# Patient Record
Sex: Female | Born: 1975
Health system: Southern US, Community
[De-identification: ages and names within clinical notes are randomized; demographics above are authoritative.]

## PROBLEM LIST (undated history)

## (undated) DIAGNOSIS — E221 Hyperprolactinemia: Secondary | ICD-10-CM

## (undated) DIAGNOSIS — N3010 Interstitial cystitis (chronic) without hematuria: Secondary | ICD-10-CM

## (undated) DIAGNOSIS — E282 Polycystic ovarian syndrome: Secondary | ICD-10-CM

## (undated) DIAGNOSIS — K5190 Ulcerative colitis, unspecified, without complications: Secondary | ICD-10-CM

## (undated) DIAGNOSIS — G43109 Migraine with aura, not intractable, without status migrainosus: Secondary | ICD-10-CM

## (undated) DIAGNOSIS — N809 Endometriosis, unspecified: Secondary | ICD-10-CM

## (undated) DIAGNOSIS — E049 Nontoxic goiter, unspecified: Secondary | ICD-10-CM

## (undated) HISTORY — PX: DIAGNOSTIC LAPAROSCOPY: SUR761

## (undated) HISTORY — DX: Ulcerative colitis, unspecified, without complications: K51.90

## (undated) HISTORY — DX: Polycystic ovarian syndrome: E28.2

## (undated) HISTORY — PX: APPENDECTOMY: SHX54

## (undated) HISTORY — DX: Nontoxic goiter, unspecified: E04.9

## (undated) HISTORY — DX: Interstitial cystitis (chronic) without hematuria: N30.10

## (undated) HISTORY — DX: Migraine with aura, not intractable, without status migrainosus: G43.109

## (undated) HISTORY — DX: Endometriosis, unspecified: N80.9

## (undated) HISTORY — DX: Hyperprolactinemia: E22.1

---

## 2006-11-28 ENCOUNTER — Ambulatory Visit: Payer: Self-pay | Admitting: Internal Medicine

## 2006-11-28 LAB — CONVERTED CEMR LAB
Basophils Relative: 0.3 % (ref 0.0–1.0)
Calcium: 8.8 mg/dL (ref 8.4–10.5)
Chloride: 105 meq/L (ref 96–112)
Creatinine, Ser: 0.7 mg/dL (ref 0.4–1.2)
Eosinophils Absolute: 0 10*3/uL (ref 0.0–0.6)
Eosinophils Relative: 1.2 % (ref 0.0–5.0)
GFR calc non Af Amer: 104 mL/min
Glucose, Bld: 81 mg/dL (ref 70–99)
HCT: 40 % (ref 36.0–46.0)
LDL Cholesterol: 127 mg/dL — ABNORMAL HIGH (ref 0–99)
Neutrophils Relative %: 41.3 % — ABNORMAL LOW (ref 43.0–77.0)
Platelets: 255 10*3/uL (ref 150–400)
Prolactin: 24.9 ng/mL
RBC: 4.26 M/uL (ref 3.87–5.11)
RDW: 12.9 % (ref 11.5–14.6)
Sodium: 139 meq/L (ref 135–145)
TSH: 1.19 microintl units/mL (ref 0.35–5.50)
Total CHOL/HDL Ratio: 3.9
Triglycerides: 94 mg/dL (ref 0–149)
VLDL: 19 mg/dL (ref 0–40)
WBC: 3.2 10*3/uL — ABNORMAL LOW (ref 4.5–10.5)

## 2006-12-12 ENCOUNTER — Other Ambulatory Visit: Admission: RE | Admit: 2006-12-12 | Discharge: 2006-12-12 | Payer: Self-pay | Admitting: Gynecology

## 2007-02-09 ENCOUNTER — Ambulatory Visit: Payer: Self-pay | Admitting: Internal Medicine

## 2007-05-08 ENCOUNTER — Encounter: Payer: Self-pay | Admitting: Internal Medicine

## 2007-07-23 ENCOUNTER — Ambulatory Visit: Payer: Self-pay | Admitting: Internal Medicine

## 2007-07-23 DIAGNOSIS — M2550 Pain in unspecified joint: Secondary | ICD-10-CM | POA: Insufficient documentation

## 2007-07-23 DIAGNOSIS — IMO0001 Reserved for inherently not codable concepts without codable children: Secondary | ICD-10-CM | POA: Insufficient documentation

## 2007-07-23 DIAGNOSIS — K5190 Ulcerative colitis, unspecified, without complications: Secondary | ICD-10-CM | POA: Insufficient documentation

## 2007-07-23 DIAGNOSIS — N808 Other endometriosis: Secondary | ICD-10-CM

## 2007-07-23 DIAGNOSIS — R109 Unspecified abdominal pain: Secondary | ICD-10-CM | POA: Insufficient documentation

## 2007-07-30 ENCOUNTER — Encounter (INDEPENDENT_AMBULATORY_CARE_PROVIDER_SITE_OTHER): Payer: Self-pay | Admitting: *Deleted

## 2007-07-30 LAB — CONVERTED CEMR LAB
Rhuematoid fact SerPl-aCnc: <20 intl units/mL — ABNORMAL LOW (ref 0.0–20.0)
Uric Acid, Serum: 3.4 mg/dL (ref 2.4–7.0)

## 2007-08-16 ENCOUNTER — Ambulatory Visit: Payer: Self-pay | Admitting: Gastroenterology

## 2007-08-16 LAB — CONVERTED CEMR LAB
Albumin: 3.9 g/dL (ref 3.5–5.2)
Alkaline Phosphatase: 67 units/L (ref 39–117)
BUN: 10 mg/dL (ref 6–23)
Eosinophils Absolute: 0.2 10*3/uL (ref 0.0–0.6)
Eosinophils Relative: 3.3 % (ref 0.0–5.0)
Ferritin: 100.9 ng/mL (ref 10.0–291.0)
Folate: 11.8 ng/mL
GFR calc Af Amer: 126 mL/min
GFR calc non Af Amer: 104 mL/min
Iron: 83 ug/dL (ref 42–145)
Lymphocytes Relative: 23.7 % (ref 12.0–46.0)
MCV: 96.4 fL (ref 78.0–100.0)
Monocytes Relative: 7.6 % (ref 3.0–11.0)
Neutro Abs: 3.8 10*3/uL (ref 1.4–7.7)
Platelets: 285 10*3/uL (ref 150–400)
Potassium: 3.8 meq/L (ref 3.5–5.1)
RBC: 3.91 M/uL (ref 3.87–5.11)
Saturation Ratios: 25.3 % (ref 20.0–50.0)
Sodium: 139 meq/L (ref 135–145)
TSH: 1.32 microintl units/mL (ref 0.35–5.50)
Tissue Transglutaminase Ab, IgA: 0.3 units (ref <7)
Transferrin: 234.6 mg/dL (ref >=212.0)
Vitamin B-12: 712 pg/mL (ref 211–911)
WBC: 5.8 10*3/uL (ref 4.5–10.5)

## 2007-09-11 ENCOUNTER — Ambulatory Visit: Payer: Self-pay | Admitting: Internal Medicine

## 2007-09-20 ENCOUNTER — Telehealth (INDEPENDENT_AMBULATORY_CARE_PROVIDER_SITE_OTHER): Payer: Self-pay | Admitting: *Deleted

## 2007-09-24 ENCOUNTER — Telehealth (INDEPENDENT_AMBULATORY_CARE_PROVIDER_SITE_OTHER): Payer: Self-pay | Admitting: *Deleted

## 2007-09-25 ENCOUNTER — Ambulatory Visit: Payer: Self-pay | Admitting: Internal Medicine

## 2007-10-17 ENCOUNTER — Encounter: Payer: Self-pay | Admitting: Internal Medicine

## 2007-11-01 ENCOUNTER — Ambulatory Visit: Payer: Self-pay | Admitting: Internal Medicine

## 2007-11-01 LAB — CONVERTED CEMR LAB
Ketones, urine, test strip: NEGATIVE
Nitrite: NEGATIVE
RBC / HPF: NONE SEEN (ref <3)
Specific Gravity, Urine: 1.02
Urobilinogen, UA: NEGATIVE
pH: 6

## 2007-11-02 ENCOUNTER — Encounter: Admission: RE | Admit: 2007-11-02 | Discharge: 2007-11-02 | Payer: Self-pay | Admitting: Internal Medicine

## 2007-11-02 ENCOUNTER — Encounter: Payer: Self-pay | Admitting: Internal Medicine

## 2007-11-05 ENCOUNTER — Telehealth (INDEPENDENT_AMBULATORY_CARE_PROVIDER_SITE_OTHER): Payer: Self-pay | Admitting: *Deleted

## 2007-11-20 DIAGNOSIS — D509 Iron deficiency anemia, unspecified: Secondary | ICD-10-CM

## 2007-11-20 DIAGNOSIS — E785 Hyperlipidemia, unspecified: Secondary | ICD-10-CM | POA: Insufficient documentation

## 2007-11-23 ENCOUNTER — Other Ambulatory Visit: Admission: RE | Admit: 2007-11-23 | Discharge: 2007-11-23 | Payer: Self-pay | Admitting: Obstetrics & Gynecology

## 2007-11-23 ENCOUNTER — Encounter: Payer: Self-pay | Admitting: Internal Medicine

## 2007-11-24 ENCOUNTER — Encounter: Admission: RE | Admit: 2007-11-24 | Discharge: 2007-11-24 | Payer: Self-pay | Admitting: Obstetrics & Gynecology

## 2007-11-29 ENCOUNTER — Encounter (INDEPENDENT_AMBULATORY_CARE_PROVIDER_SITE_OTHER): Payer: Self-pay | Admitting: *Deleted

## 2008-02-05 ENCOUNTER — Telehealth: Payer: Self-pay | Admitting: Internal Medicine

## 2008-02-08 ENCOUNTER — Encounter (INDEPENDENT_AMBULATORY_CARE_PROVIDER_SITE_OTHER): Payer: Self-pay | Admitting: *Deleted

## 2008-03-04 ENCOUNTER — Encounter: Payer: Self-pay | Admitting: Internal Medicine

## 2008-03-04 ENCOUNTER — Encounter: Payer: Self-pay | Admitting: Family Medicine

## 2008-03-11 ENCOUNTER — Encounter: Payer: Self-pay | Admitting: Family Medicine

## 2008-03-18 ENCOUNTER — Other Ambulatory Visit: Admission: RE | Admit: 2008-03-18 | Discharge: 2008-03-18 | Payer: Self-pay | Admitting: Obstetrics and Gynecology

## 2008-05-09 ENCOUNTER — Ambulatory Visit (HOSPITAL_BASED_OUTPATIENT_CLINIC_OR_DEPARTMENT_OTHER): Admission: RE | Admit: 2008-05-09 | Discharge: 2008-05-09 | Payer: Self-pay | Admitting: Urology

## 2008-06-30 ENCOUNTER — Encounter: Payer: Self-pay | Admitting: Internal Medicine

## 2008-08-21 ENCOUNTER — Ambulatory Visit: Payer: Self-pay | Admitting: Family Medicine

## 2008-08-21 DIAGNOSIS — J019 Acute sinusitis, unspecified: Secondary | ICD-10-CM | POA: Insufficient documentation

## 2008-10-18 ENCOUNTER — Emergency Department (HOSPITAL_BASED_OUTPATIENT_CLINIC_OR_DEPARTMENT_OTHER): Admission: EM | Admit: 2008-10-18 | Discharge: 2008-10-18 | Payer: Self-pay | Admitting: Emergency Medicine

## 2008-10-20 ENCOUNTER — Emergency Department (HOSPITAL_BASED_OUTPATIENT_CLINIC_OR_DEPARTMENT_OTHER): Admission: EM | Admit: 2008-10-20 | Discharge: 2008-10-20 | Payer: Self-pay | Admitting: Emergency Medicine

## 2008-11-04 ENCOUNTER — Ambulatory Visit: Payer: Self-pay | Admitting: Family Medicine

## 2008-11-04 DIAGNOSIS — N3000 Acute cystitis without hematuria: Secondary | ICD-10-CM | POA: Insufficient documentation

## 2008-11-11 ENCOUNTER — Ambulatory Visit: Payer: Self-pay | Admitting: Family Medicine

## 2008-11-11 LAB — CONVERTED CEMR LAB
Bilirubin Urine: NEGATIVE
Ketones, urine, test strip: NEGATIVE
Specific Gravity, Urine: 1.015
Urobilinogen, UA: 0.2

## 2008-11-23 LAB — CONVERTED CEMR LAB
AST: 27 units/L (ref 0–37)
Alkaline Phosphatase: 53 units/L (ref 39–117)
Basophils Absolute: 0.1 10*3/uL (ref 0.0–0.1)
Bilirubin, Direct: 0.1 mg/dL (ref 0.0–0.3)
Chloride: 99 meq/L (ref 96–112)
Eosinophils Absolute: 0.1 10*3/uL (ref 0.0–0.7)
GFR calc non Af Amer: 102 mL/min
HDL: 45.2 mg/dL (ref >39.0)
MCHC: 33.5 g/dL (ref 30.0–36.0)
MCV: 97 fL (ref 78.0–100.0)
Neutrophils Relative %: 37.1 % — ABNORMAL LOW (ref 43.0–77.0)
Platelets: 246 10*3/uL (ref 150–400)
Potassium: 3.4 meq/L — ABNORMAL LOW (ref 3.5–5.1)
Total Bilirubin: 0.9 mg/dL (ref 0.3–1.2)
Triglycerides: 170 mg/dL — ABNORMAL HIGH (ref 0–149)
VLDL: 34 mg/dL (ref 0–40)
WBC: 3.9 10*3/uL — ABNORMAL LOW (ref 4.5–10.5)

## 2008-11-24 ENCOUNTER — Telehealth (INDEPENDENT_AMBULATORY_CARE_PROVIDER_SITE_OTHER): Payer: Self-pay | Admitting: *Deleted

## 2008-11-24 ENCOUNTER — Encounter (INDEPENDENT_AMBULATORY_CARE_PROVIDER_SITE_OTHER): Payer: Self-pay | Admitting: *Deleted

## 2009-02-17 ENCOUNTER — Ambulatory Visit: Payer: Self-pay | Admitting: Family Medicine

## 2009-02-24 ENCOUNTER — Encounter (INDEPENDENT_AMBULATORY_CARE_PROVIDER_SITE_OTHER): Payer: Self-pay | Admitting: *Deleted

## 2009-02-24 LAB — CONVERTED CEMR LAB
ALT: 26 units/L (ref 0–35)
Basophils Relative: 1.4 % (ref 0.0–3.0)
Eosinophils Relative: 6.2 % — ABNORMAL HIGH (ref 0.0–5.0)
HCT: 39.3 % (ref 36.0–46.0)
HDL: 42.9 mg/dL (ref >39.00)
MCV: 94.7 fL (ref 78.0–100.0)
Monocytes Absolute: 0.3 10*3/uL (ref 0.1–1.0)
Neutrophils Relative %: 32.3 % — ABNORMAL LOW (ref 43.0–77.0)
RBC: 4.15 M/uL (ref 3.87–5.11)
Total Bilirubin: 0.7 mg/dL (ref 0.3–1.2)
Total CHOL/HDL Ratio: 5
Triglycerides: 161 mg/dL — ABNORMAL HIGH (ref 0.0–149.0)
WBC: 3.7 10*3/uL — ABNORMAL LOW (ref 4.5–10.5)

## 2009-03-23 ENCOUNTER — Ambulatory Visit: Payer: Self-pay | Admitting: Family Medicine

## 2009-03-23 DIAGNOSIS — M79609 Pain in unspecified limb: Secondary | ICD-10-CM

## 2009-03-23 DIAGNOSIS — E049 Nontoxic goiter, unspecified: Secondary | ICD-10-CM | POA: Insufficient documentation

## 2009-03-25 ENCOUNTER — Ambulatory Visit: Payer: Self-pay

## 2009-03-25 ENCOUNTER — Encounter: Payer: Self-pay | Admitting: Family Medicine

## 2009-03-26 ENCOUNTER — Telehealth (INDEPENDENT_AMBULATORY_CARE_PROVIDER_SITE_OTHER): Payer: Self-pay | Admitting: *Deleted

## 2009-03-26 ENCOUNTER — Encounter: Admission: RE | Admit: 2009-03-26 | Discharge: 2009-03-26 | Payer: Self-pay | Admitting: Family Medicine

## 2009-04-07 ENCOUNTER — Encounter (INDEPENDENT_AMBULATORY_CARE_PROVIDER_SITE_OTHER): Payer: Self-pay | Admitting: *Deleted

## 2009-04-20 ENCOUNTER — Ambulatory Visit: Payer: Self-pay | Admitting: Family Medicine

## 2009-04-20 LAB — CONVERTED CEMR LAB
Free T4: 0.8 ng/dL (ref 0.6–1.6)
TSH: 2.33 microintl units/mL (ref 0.35–5.50)

## 2009-04-21 ENCOUNTER — Encounter (INDEPENDENT_AMBULATORY_CARE_PROVIDER_SITE_OTHER): Payer: Self-pay | Admitting: *Deleted

## 2009-05-18 ENCOUNTER — Telehealth (INDEPENDENT_AMBULATORY_CARE_PROVIDER_SITE_OTHER): Payer: Self-pay | Admitting: *Deleted

## 2011-02-15 NOTE — Assessment & Plan Note (Signed)
[facility]                         GASTROENTEROLOGY OFFICE NOTE   Mindy Lowe, Mindy Lowe                    MRN:          [id number]  DATE:08/16/2007                            DOB:          January 26, 1976    Mindy Lowe is a 35 year old Hispanic female referred through the  courtesy of Dr. Drue Novel for evaluation of possible ulcerative colitis.   HISTORY OF PRESENT ILLNESS:  Some 10 years ago, the patient developed a  bloody diarrhea while living in [place], [place], and was diagnosed by a  gastroenterologist as having ulcerative colitis.  She was apparently  treated with oral zolpidem, but developed nausea and had to discontinue  this medication, but had a good response and has had no relapse in the  last 10 years.  In fact, she currently is constipated since being  started on iron therapy, because of some iron deficiency anemia,  associated with heavy menstrual blood loss.  She was on birth control  pills for 10 years, but stopped these 4 years ago.  She had been  diagnosed in the past as having endometriosis, and has had several  laparoscopies.  She also had laparoscopic appendectomy in 2003 for acute  appendicitis.   She currently has regular bowel movements and has had no rectal  bleeding.  She does have periodic crampy lower abdominal pain, but this  seems to be related to her menstrual cycle.  She has some gas and  bloating, but denies upper GI or hepatobiliary complaints.  She has  recently had some intermittent right CVA tenderness and apparently had a  negative urinalysis.  She gives no history of nephrolithiasis or other  genitourinary complaints.  Her appetite is good and she has, in fact,  gained 20 pounds over the last year.  She denies any specific food  intolerances.  She has no associated skin rashes, joint pains, or oral  stomatitis.  Recent work by Dr. Alwyn Ren showed a normal serum uric acid,  sedimentation rate of 18, and a negative  rheumatoid factor.   PAST MEDICAL HISTORY:  Otherwise, noncontributory, except for a history  of hyperlipidemia.   MEDICATIONS:  1. Flutamide 125 mg 2 twice a day for hair loss per her dermatologist.  2. Ferrex 150 mg a day.  3. Various vitamins and mineral supplements.  She denies drug      allergies.   FAMILY HISTORY:  Remarkable for ulcerative colitis in her father and 2  second cousins.  Her grandfather and father both have diabetes.  Her  grandmother had ovarian cancer.   SOCIAL HISTORY:  She is single and lives with her mother.  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  She does not smoke or  use ethanol.   REVIEW OF SYSTEMS:  Remarkable for some nonspecific night sweats,  excessive thirst, menometrorrhagia, chronic fatigue.  Her last menstrual  period was August 11, 2007.  Review of systems otherwise  noncontributory without any current cardiovascular, pulmonary,  genitourinary, neurological, orthopedic, or psychiatric difficulties.   EXAMINATION:  Shows her to be an attractive, healthy-appearing female in  no acute distress.  She is 5 feet 5 inches tall and weighs 159 pounds.  Blood pressure is  100/70 and pulse was 80 and regular.  I could not appreciate thyromegaly or lymphadenopathy.  Her chest was clear and she appeared to be in a regular rhythm without  murmurs, gallops, or rubs.  There is no hepatosplenomegaly, abdominal mass, but there was slight  tenderness over a mobile sigmoid colon in the left lower quadrant area.  Bowel sounds were normal.  Peripheral extremities were unremarkable.  Mental status was clear.  Inspection of the rectum was unremarkable, as was rectal exam.  There  was soft stool present that was guaiac negative.   ASSESSMENT:  1. Left lower quadrant pain, which seems to be menstrual related and I      suspect she has endometrial involvement of her rectosigmoid area.  2. Vague previous history of ulcerative colitis.  She  certainly has no      symptomatology now that suggests active inflammatory bowel disease.  3. Status post appendectomy and several laparoscopic exams for      endometrial ablation.  4. Alopecia of unexplained etiology, being followed by a dermatologist      in [facility].  5. Iron deficiency anemia related to menometrorrhagia.  6. Family history of inflammatory bowel disease, ovarian cancer, and      diabetes.  7. Nonspecific right costovertebral angle tenderness without any known      history of hepatobiliary problems.   RECOMMENDATIONS:  1. Screening laboratory parameters including, at her request, sprue      antibodies and IBD serology.  2. Anemia profile.  Check iron levels.  3. Baseline colonoscopy exam.  4. Consider referral back to gynecology for possible repeat      laparoscopic evaluation to continue on her workup and colonoscopy      results.     Mindy Lowe. Jarold Motto, MD, Caleen Essex, FAGA  Electronically Signed    DRP/MedQ  DD: 08/16/2007  DT: 08/16/2007  Job #: [id number]   cc:   Willow Ora, MD  Dr. Sharol Harness, M.D.

## 2011-02-15 NOTE — Op Note (Signed)
[name], [name]           ACCOUNT NO.:  [id number]   MEDICAL RECORD NO.:  [id number]          PATIENT TYPE:  AMB   LOCATION:  [facility]                         FACILITY:  [facility]   PHYSICIAN:  Martina Sinner, MD DATE OF BIRTH:  1976-05-11   DATE OF PROCEDURE:  05/09/2008  DATE OF DISCHARGE:                               OPERATIVE REPORT   PREOPERATIVE DIAGNOSES:  1. Pelvic pain  2. Frequency.   POSTOPERATIVE DIAGNOSES:  1. Pelvic pain  2. Frequency.   PROCEDURES:  Cystoscopy, hydrodistention, and bladder instillation  therapy.   [name], based upon her history of and urodynamics, I felt she  likely had interstitial cystitis.   The patient was prepped and draped in the usual fashion.  She was given  preoperative antibiotics.  She was cystoscope with a 17-French scope.  Bladder mucosa and trigone were normal.  There was no stitch, foreign  body, or carcinoma.  She was dilated to 700 mL twice.  She had a few  petechiae at 5 and 7 o'clock after I emptied the bladder and relooked.  The petechiae were just lateral to the ureteral orifices bilaterally.  The findings were not that impressive.   I instilled 15 cc of .5% marcaine and 400 mg of pyridium with a small  red rubber catheter.   The cystoscopic findings for Ms. Mindy Lowe were not that impressive for  interstitial cystitis, but when I look at the entire clinical situation,  I think she does have interstitial cystitis.  If she does go on to have  bladder instillation rescue treatments and responds favorably, this  would also support the diagnosis.   The patient will return in a week.           ______________________________  Martina Sinner, MD  Electronically Signed     SAM/MEDQ  D:  05/09/2008  T:  05/09/2008  Job:  [id number]

## 2011-07-01 LAB — POCT PREGNANCY, URINE: Preg Test, Ur: NEGATIVE

## 2011-07-01 LAB — POCT HEMOGLOBIN-HEMACUE: Hemoglobin: 15

## 2012-07-10 ENCOUNTER — Other Ambulatory Visit: Payer: Self-pay | Admitting: *Deleted

## 2012-07-10 DIAGNOSIS — E049 Nontoxic goiter, unspecified: Secondary | ICD-10-CM

## 2012-07-10 DIAGNOSIS — E041 Nontoxic single thyroid nodule: Secondary | ICD-10-CM

## 2012-07-13 ENCOUNTER — Ambulatory Visit
Admission: RE | Admit: 2012-07-13 | Discharge: 2012-07-13 | Disposition: A | Discharge status: Home / Self Care | Payer: 59 | Source: Ambulatory Visit | Attending: *Deleted | Admitting: *Deleted

## 2012-07-13 DIAGNOSIS — E049 Nontoxic goiter, unspecified: Secondary | ICD-10-CM

## 2012-11-16 ENCOUNTER — Encounter: Payer: Self-pay | Admitting: Gynecology

## 2012-11-16 DIAGNOSIS — N809 Endometriosis, unspecified: Secondary | ICD-10-CM | POA: Insufficient documentation

## 2012-11-16 DIAGNOSIS — G43109 Migraine with aura, not intractable, without status migrainosus: Secondary | ICD-10-CM | POA: Insufficient documentation

## 2012-11-16 DIAGNOSIS — N3010 Interstitial cystitis (chronic) without hematuria: Secondary | ICD-10-CM | POA: Insufficient documentation

## 2012-11-16 DIAGNOSIS — E221 Hyperprolactinemia: Secondary | ICD-10-CM | POA: Insufficient documentation

## 2012-11-16 DIAGNOSIS — E282 Polycystic ovarian syndrome: Secondary | ICD-10-CM | POA: Insufficient documentation

## 2013-01-02 ENCOUNTER — Encounter: Payer: Self-pay | Admitting: Gynecology

## 2013-01-02 ENCOUNTER — Ambulatory Visit (INDEPENDENT_AMBULATORY_CARE_PROVIDER_SITE_OTHER): Payer: 59 | Admitting: Gynecology

## 2013-01-02 VITALS — BP 112/60 | Resp 12 | Wt 146.0 lb

## 2013-01-02 DIAGNOSIS — N803 Endometriosis of pelvic peritoneum: Secondary | ICD-10-CM

## 2013-01-02 DIAGNOSIS — N760 Acute vaginitis: Secondary | ICD-10-CM

## 2013-01-02 DIAGNOSIS — Z79899 Other long term (current) drug therapy: Secondary | ICD-10-CM

## 2013-01-02 MED ORDER — NORETHIN ACE-ETH ESTRAD-FE 1-20 MG-MCG(24) PO TABS: 1.0000 | ORAL_TABLET | Freq: Every day | ORAL | Status: DC

## 2013-01-02 MED ORDER — HYDROCORTISONE ACETATE 25 MG RE SUPP: 25.0000 mg | Freq: Every day | RECTAL | Status: DC

## 2013-01-02 MED ORDER — FLUCONAZOLE 150 MG PO TABS: 150.0000 mg | ORAL_TABLET | Freq: Once | ORAL | Status: DC

## 2013-01-02 NOTE — Progress Notes (Signed)
PT to discuss medication, on Dostinex for hyperprolactinemia overall feels great.  Stopped spironolactone.  Pt reports pain from endometriosis is reduced using occaisional aspirin menses better on LoEstrin.  Pt contemplating pregnancy has questions re Dostinex and endometriosis.  Also reports chronic vulvar itch  Review of Systems - Negative except  vulvar itching as above.  Pt denies headache, pelvic pain, dysmenorrhea.  Pt reports acne resolved and alopecia improved.  Physical Examination: General appearance - alert, well appearing, and in no distress Pelvic - normal external genitalia, vulva, cervix, uterus and adnexa, VAGINA: normal appearing vagina with normal color, thin white  discharge, no lesions, vaginal tenderness nonspecific,  vaginal discharge - white and milky, WET MOUNT done - results: negative for pathogens, normal epithelial cells, vaginal pH is 5, white blood cells-many, few yeast Neurological - alert, oriented, normal speech, no focal findings or movement disorder noted  Assessment: Hyperprolactinemia-doing well on Dostinex Chronic vulvitis  Plan: Con't dostinex-stressed importance for regulation of menses D/C consistent with DIV, will treat locally with vaginal steroids-will treat for yeast at onset and completion of RX F/u 10 to confirm clearance

## 2013-01-02 NOTE — Patient Instructions (Signed)
Candida Infection, Adult  A candida infection (also called yeast, fungus and Monilia infection) is an overgrowth of yeast that can occur anywhere on the body. A yeast infection commonly occurs in warm, moist body areas. Usually, the infection remains localized but can spread to become a systemic infection. A yeast infection may be a sign of a more severe disease such as diabetes, leukemia, or AIDS.  A yeast infection can occur in both men and women. In women, Candida vaginitis is a vaginal infection. It is one of the most common causes of vaginitis. Men usually do not have symptoms or know they have an infection until other problems develop. Men may find out they have a yeast infection because their sex partner has a yeast infection. Uncircumcised men are more likely to get a yeast infection than circumcised men. This is because the uncircumcised glans is not exposed to air and does not remain as dry as that of a circumcised glans. Older adults may develop yeast infections around dentures.  CAUSES   Women   Antibiotics.   Steroid medication taken for a long time.   Being overweight (obese).   Diabetes.   Poor immune condition.   Certain serious medical conditions.   Immune suppressive medications for organ transplant patients.   Chemotherapy.   Pregnancy.   Menstration.   Stress and fatigue.   Intravenous drug use.   Oral contraceptives.   Wearing tight-fitting clothes in the crotch area.   Catching it from a sex partner who has a yeast infection.   Spermicide.   Intravenous, urinary, or other catheters.  Men   Catching it from a sex partner who has a yeast infection.   Having oral or anal sex with a person who has the infection.   Spermicide.   Diabetes.   Antibiotics.   Poor immune system.   Medications that suppress the immune system.   Intravenous drug use.   Intravenous, urinary, or other catheters.  SYMPTOMS   Women   Thick, white vaginal discharge.   Vaginal itching.   Redness and  swelling in and around the vagina.   Irritation of the lips of the vagina and perineum.   Blisters on the vaginal lips and perineum.   Painful sexual intercourse.   Low blood sugar (hypoglycemia).   Painful urination.   Bladder infections.   Intestinal problems such as constipation, indigestion, bad breath, bloating, increase in gas, diarrhea, or loose stools.  Men   Men may develop intestinal problems such as constipation, indigestion, bad breath, bloating, increase in gas, diarrhea, or loose stools.   Dry, cracked skin on the penis with itching or discomfort.   Jock itch.   Dry, flaky skin.   Athlete's foot.   Hypoglycemia.  DIAGNOSIS   Women   A history and an exam are performed.   The discharge may be examined under a microscope.   A culture may be taken of the discharge.  Men   A history and an exam are performed.   Any discharge from the penis or areas of cracked skin will be looked at under the microscope and cultured.   Stool samples may be cultured.  TREATMENT   Women   Vaginal antifungal suppositories and creams.   Medicated creams to decrease irritation and itching on the outside of the vagina.   Warm compresses to the perineal area to decrease swelling and discomfort.   Oral antifungal medications.   Medicated vaginal suppositories or cream for repeated or recurrent infections.     Wash and dry the irritation areas before applying the cream.   Eating yogurt with lactobacillus may help with prevention and treatment.   Sometimes painting the vagina with gentian violet solution may help if creams and suppositories do not work.  Men   Antifungal creams and oral antifungal medications.   Sometimes treatment must continue for 30 days after the symptoms go away to prevent recurrence.  HOME CARE INSTRUCTIONS   Women   Use cotton underwear and avoid tight-fitting clothing.   Avoid colored, scented toilet paper and deodorant tampons or pads.   Do not douche.   Keep your diabetes  under control.   Finish all the prescribed medications.   Keep your skin clean and dry.   Consume milk or yogurt with lactobacillus active culture regularly. If you get frequent yeast infections and think that is what the infection is, there are over-the-counter medications that you can get. If the infection does not show healing in 3 days, talk to your caregiver.   Tell your sex partner you have a yeast infection. Your partner may need treatment also, especially if your infection does not clear up or recurs.  Men   Keep your skin clean and dry.   Keep your diabetes under control.   Finish all prescribed medications.   Tell your sex partner that you have a yeast infection so they can be treated if necessary.  SEEK MEDICAL CARE IF:    Your symptoms do not clear up or worsen in one week after treatment.   You have an oral temperature above 102 F (38.9 C).   You have trouble swallowing or eating for a prolonged time.   You develop blisters on and around your vagina.   You develop vaginal bleeding and it is not your menstrual period.   You develop abdominal pain.   You develop intestinal problems as mentioned above.   You get weak or lightheaded.   You have painful or increased urination.   You have pain during sexual intercourse.  MAKE SURE YOU:    Understand these instructions.   Will watch your condition.   Will get help right away if you are not doing well or get worse.  Document Released: 10/27/2004 Document Revised: 12/12/2011 Document Reviewed: 02/08/2010  Exit[employer] Patient Information 2013 [employer].

## 2013-01-07 ENCOUNTER — Telehealth: Payer: Self-pay | Admitting: Gynecology

## 2013-01-07 NOTE — Telephone Encounter (Signed)
She will use for 1w, vaginally she is to come in for test of cure, sometimes it requires retreatment, so don't discard the extras

## 2013-01-07 NOTE — Telephone Encounter (Signed)
Spoke with pt about med she is confused about. Says pharmacy gave her Anucort HC suppositories, which pt says, according to the box, are to be used rectally. Instructed pt that they are a hydrocortisone suppository, which is what Dr Farrel Gobble wanted her to use, and they are fine to use pv. Pt was given 24 suppositories and thought she was to use them only for 7 nights. Pt just wanted to make sure she should only use 7 nights.  aa

## 2013-01-07 NOTE — Telephone Encounter (Signed)
Amy have you talked to this patient regarding this?

## 2013-01-07 NOTE — Telephone Encounter (Signed)
pt thinks wrong rx may have been called into pharmacy at last visit/please advise/[place]

## 2013-01-08 NOTE — Telephone Encounter (Signed)
LM on pt's VM confirming name that suppositories are correct, and she should use them for 7 days. Instructed to not discard the extras. Instructed pt to call back for TOC appt.  aa

## 2013-01-17 ENCOUNTER — Ambulatory Visit: Payer: 59 | Admitting: Gynecology

## 2013-01-22 ENCOUNTER — Other Ambulatory Visit: Payer: Self-pay | Admitting: Gynecology

## 2013-01-22 ENCOUNTER — Ambulatory Visit (INDEPENDENT_AMBULATORY_CARE_PROVIDER_SITE_OTHER): Payer: 59 | Admitting: Gynecology

## 2013-01-22 VITALS — BP 94/58 | Temp 98.3°F | Resp 16

## 2013-01-22 DIAGNOSIS — N72 Inflammatory disease of cervix uteri: Secondary | ICD-10-CM

## 2013-01-22 DIAGNOSIS — R300 Dysuria: Secondary | ICD-10-CM

## 2013-01-22 LAB — POCT URINALYSIS DIPSTICK
Urobilinogen, UA: NEGATIVE
pH, UA: 5

## 2013-01-22 MED ORDER — DOXYCYCLINE HYCLATE 100 MG PO CAPS: 100.0000 mg | ORAL_CAPSULE | Freq: Two times a day (BID) | ORAL | Status: DC

## 2013-01-22 NOTE — Patient Instructions (Signed)
Start doxycycline for now will update treatment as results come in, pelvic rest, follow up based on findings

## 2013-01-22 NOTE — Progress Notes (Signed)
Subjective:     Patient ID: Mindy Lowe, female   DOB: [birth year], 37 y.o.   MRN: [id number]  HPI Comments: Pt completed vaginal steroids finished about 8d ago, treated for yeast as well, Pt reports no improvement at all, more irritation inside, reports discharge unchanged.  Reports sexually active after completing rx, painful, burning.  Pt reports back pain began 4d ago, took aspirin for 2d now resolved, pt reports urine cloudy  Back Pain     Review of Systems  Musculoskeletal: Positive for back pain.  All other systems reviewed and are negative.       Objective:   Physical Exam  Constitutional: She appears well-developed and well-nourished.  Genitourinary: There is no rash, tenderness or lesion on the right labia. There is no rash, tenderness or lesion on the left labia. Uterus is tender. Cervix exhibits motion tenderness and friability. There is tenderness around the vagina.  Grey, creamy vaginal discharge noted, nonspecific tenderness in vagina, cervix and uterus       Assessment:     Nonspecific vaginitis    Plan:     cerivcal swab done for GC/CTM, yeast-[id number]-trich, BV Empirical treatment with doxycycline Will plan follow up based on lab results, pelvic rest-pt agreeable

## 2013-01-24 ENCOUNTER — Ambulatory Visit: Payer: 59 | Admitting: Gynecology

## 2013-01-25 LAB — STD SCREENING PANEL/HIGH RISK
Chlamydia trachomatis, NAA: NOT DETECTED
Gardnerella vaginalis: NOT DETECTED

## 2013-02-26 ENCOUNTER — Telehealth: Payer: Self-pay | Admitting: *Deleted

## 2013-02-26 NOTE — Telephone Encounter (Signed)
Left Message To Call Back Re: (See Lab STD PANEL SCREENING )Per Dr. Farrel Gobble pt needs to come in re having pain in leg; Dr. Farrel Gobble wants to make sure it's not DVT, as far as pt. Being switched to loestrin, lomedia is loestrin, Dr. Farrel Gobble doesn't have a problem switching her but it won't be to loestrin since it's no longer on the market.

## 2013-02-26 NOTE — Telephone Encounter (Signed)
Notified pt. She is aware (see below) scheduled her to come in 03/04/13 @ 4:45

## 2013-03-04 ENCOUNTER — Ambulatory Visit (INDEPENDENT_AMBULATORY_CARE_PROVIDER_SITE_OTHER): Payer: 59 | Admitting: Gynecology

## 2013-03-04 VITALS — BP 112/60 | Resp 14 | Wt 144.0 lb

## 2013-03-04 DIAGNOSIS — M79661 Pain in right lower leg: Secondary | ICD-10-CM

## 2013-03-04 DIAGNOSIS — IMO0001 Reserved for inherently not codable concepts without codable children: Secondary | ICD-10-CM

## 2013-03-04 DIAGNOSIS — M79609 Pain in unspecified limb: Secondary | ICD-10-CM

## 2013-03-04 DIAGNOSIS — N644 Mastodynia: Secondary | ICD-10-CM

## 2013-03-04 DIAGNOSIS — Z309 Encounter for contraceptive management, unspecified: Secondary | ICD-10-CM

## 2013-03-04 MED ORDER — NORETHIN ACE-ETH ESTRAD-FE 1-20 MG-MCG PO TABS: 1.0000 | ORAL_TABLET | Freq: Every day | ORAL | Status: DC

## 2013-03-04 NOTE — Progress Notes (Signed)
Subjective:     Patient ID: Mindy Lowe, female   DOB: 17-Mar-1976, 37 y.o.   MRN: [id number]  HPI Comments: Pt asked to come in after complaining of right calf tenderness for 24m, pt denies swelling, change in color, ambulation or strength.  Pt has been doing spin classes 15m.  In addition pt reports hair loss since being on Lomedia as well as breast tenderness on the left.  She continues to take her dostinex and has no nipple discharge.  Pt would like to return to LoEstrin product    Review of Systems  All other systems reviewed and are negative.       Objective:   Physical Exam  Constitutional: She appears well-developed and well-nourished.  Pulmonary/Chest: Right breast exhibits no inverted nipple, no mass, no nipple discharge, no skin change and no tenderness. Left breast exhibits mass and tenderness. Left breast exhibits no inverted nipple, no nipple discharge and no skin change.    Genitourinary: Tenderness: lateral left breast.  Musculoskeletal: She exhibits no edema and no tenderness.       Right knee: Normal. She exhibits no swelling, no effusion, no ecchymosis, no deformity, no erythema and normal patellar mobility. No tenderness found.       Right lower leg: Normal. She exhibits no tenderness and no edema.       Assessment:     Calf pain Breast mass-cystic     Plan:     Pt assured pain is behind knee and negative Homen's sign, no progression in 64m, suspected related to new onset of spin class-assured Will change back to LoEstrin per request Refer to breast center for U/s and diagnostic mammo-suspect fibrocystic

## 2013-03-05 ENCOUNTER — Other Ambulatory Visit: Payer: Self-pay | Admitting: Gynecology

## 2013-03-05 ENCOUNTER — Telehealth: Payer: Self-pay | Admitting: *Deleted

## 2013-03-05 DIAGNOSIS — N644 Mastodynia: Secondary | ICD-10-CM

## 2013-03-05 NOTE — Telephone Encounter (Signed)
Patient notified of Diagnostic Digital Mammogram and ultrasound to be done at [facility] June 4th @ 3:00pm. Dr Farrel Gobble need to put in order for ultrasound [facility]. sue

## 2013-03-06 ENCOUNTER — Ambulatory Visit
Admission: RE | Admit: 2013-03-06 | Discharge: 2013-03-06 | Disposition: A | Discharge status: Home / Self Care | Payer: 59 | Source: Ambulatory Visit | Attending: Gynecology | Admitting: Gynecology

## 2013-03-06 DIAGNOSIS — N644 Mastodynia: Secondary | ICD-10-CM

## 2013-03-12 ENCOUNTER — Telehealth: Payer: Self-pay | Admitting: *Deleted

## 2013-03-12 NOTE — Telephone Encounter (Signed)
Left Message To Call Back  

## 2013-03-12 NOTE — Telephone Encounter (Signed)
Message copied by Lorraine Lax on Tue Mar 12, 2013  8:13 AM ------      Message from: Douglass Rivers      Created: Mon Mar 11, 2013  4:58 PM       Can pt rto to confirm mass/LN has resolved in 34m ------

## 2013-03-14 NOTE — Telephone Encounter (Signed)
Appt scheduled for 06/17/13 @ 4:45 (see mmg result message)

## 2013-04-27 ENCOUNTER — Other Ambulatory Visit: Payer: Self-pay | Admitting: Gynecology

## 2013-04-29 ENCOUNTER — Telehealth: Payer: Self-pay | Admitting: *Deleted

## 2013-04-29 MED ORDER — CABERGOLINE 0.5 MG PO TABS: 0.2500 mg | ORAL_TABLET | ORAL | Status: DC

## 2013-04-29 NOTE — Telephone Encounter (Signed)
Refill for Dostinex 0.5 mg  (2 tabs weekly) #8/0 refills sent to pt's pharmacy. Pt will call office to schedule aex appt. Pt aware of no more refills until aex.  

## 2013-04-29 NOTE — Telephone Encounter (Signed)
Ok to refill for 53m, please remind pt she is overdue for annual

## 2013-04-29 NOTE — Telephone Encounter (Signed)
Pt is requesting a refill on Dostinex. Last Rx for this medication was given to pt on 10/08/2012 - #8,  x6 refills. Please advise.

## 2013-04-29 NOTE — Telephone Encounter (Signed)
Refill for Dostinex 0.5 mg  (2 tabs weekly) #8/0 refills sent to pt's pharmacy. Pt will call office to schedule aex appt. Pt aware of no more refills until aex.

## 2013-05-13 ENCOUNTER — Telehealth: Payer: Self-pay | Admitting: *Deleted

## 2013-05-13 NOTE — Telephone Encounter (Signed)
Patient notified that being on a lower does can be normal not to have withdrawal bleeding, pt has been taking ocp as you have instructed her to she says she's sure she's not pregnant. Pt is agreeable and aware.

## 2013-05-13 NOTE — Telephone Encounter (Signed)
Patient calling to say she hasn't had a cycle in 2 months she is on loestrin 1/20 wants to know if this is normal please advise.

## 2013-05-13 NOTE — Telephone Encounter (Signed)
On some of the lower dose pills it can be normal to not have a withdraw bleed, as long as she is taking it correctly and has not added any other medications she should be fine, she can take a otc upt for assurance

## 2013-06-04 ENCOUNTER — Telehealth: Payer: Self-pay | Admitting: Gynecology

## 2013-06-04 NOTE — Telephone Encounter (Signed)
Microgestin refill needed please.  [phone] [facility]

## 2013-06-04 NOTE — Telephone Encounter (Signed)
Correction 3 packs with 3 rf's

## 2013-06-04 NOTE — Telephone Encounter (Signed)
03/04/13 Dr. Farrel Gobble sent through 3 packs/4 rf's to patient's pharmacy she is aware.

## 2013-06-17 ENCOUNTER — Ambulatory Visit: Payer: 59 | Admitting: Gynecology

## 2013-06-23 ENCOUNTER — Other Ambulatory Visit: Payer: Self-pay | Admitting: Gynecology

## 2013-06-24 NOTE — Telephone Encounter (Signed)
Please advise if should refill this. Patient has 3 month breast recheck scheduled for 07/19/13. Chart is on your desk.

## 2013-07-19 ENCOUNTER — Encounter: Payer: Self-pay | Admitting: Gynecology

## 2013-07-19 ENCOUNTER — Ambulatory Visit (INDEPENDENT_AMBULATORY_CARE_PROVIDER_SITE_OTHER): Payer: 59 | Admitting: Gynecology

## 2013-07-19 VITALS — BP 116/76 | HR 80 | Resp 18 | Ht 64.25 in | Wt 139.0 lb

## 2013-07-19 DIAGNOSIS — R5381 Other malaise: Secondary | ICD-10-CM

## 2013-07-19 DIAGNOSIS — N644 Mastodynia: Secondary | ICD-10-CM

## 2013-07-19 DIAGNOSIS — IMO0001 Reserved for inherently not codable concepts without codable children: Secondary | ICD-10-CM

## 2013-07-19 DIAGNOSIS — Z309 Encounter for contraceptive management, unspecified: Secondary | ICD-10-CM

## 2013-07-19 DIAGNOSIS — E229 Hyperfunction of pituitary gland, unspecified: Secondary | ICD-10-CM

## 2013-07-19 DIAGNOSIS — E221 Hyperprolactinemia: Secondary | ICD-10-CM

## 2013-07-19 LAB — CBC
HCT: 39.3 % (ref 36.0–46.0)
Hemoglobin: 13.4 g/dL (ref 12.0–15.0)
MCV: 97 fL (ref 78.0–100.0)
RDW: 13.8 % (ref 11.5–15.5)
WBC: 5.5 10*3/uL (ref 4.0–10.5)

## 2013-07-19 MED ORDER — NORETHIN ACE-ETH ESTRAD-FE 1-20 MG-MCG PO TABS: ORAL_TABLET | ORAL | Status: DC

## 2013-07-19 MED ORDER — CABERGOLINE 0.5 MG PO TABS: 0.2500 mg | ORAL_TABLET | ORAL | Status: DC

## 2013-07-19 NOTE — Progress Notes (Signed)
Pt here for follow up of breast tenderness that was lymph node on imaging, pt states that breast can be tender.  She is pleased with current ocp and would like to continue.  Pt is reporting some fatigue and need for coffee.  Is traveling for work.  ROS as above  BP 116/76  Pulse 80  Resp 18  Ht 5' 4.25" (1.632 m)  Wt 139 lb (63.05 kg)  BMI 23.67 kg/m2 General appearance: alert, cooperative and appears stated age Breasts: Inspection negative, No nipple retraction or dimpling, No axillary or supraclavicular adenopathy, Normal to palpation without dominant masses Neurologic: Grossly normal  Assessment: breast tenderness hyperprolactinemia fatigue  Plan:  Tenderness resolved Check PRL refill dostinex Refill ocp Check CBC but may be related to travel F/u prn or annual

## 2013-07-22 ENCOUNTER — Telehealth: Payer: Self-pay | Admitting: *Deleted

## 2013-07-22 NOTE — Telephone Encounter (Signed)
Message copied by Lorraine Lax on Mon Jul 22, 2013  9:48 AM ------      Message from: Douglass Rivers      Created: Sat Jul 20, 2013  4:17 PM       Look great, Please inform the patient.       ------

## 2013-07-22 NOTE — Telephone Encounter (Signed)
Left Message To Call Back  

## 2013-07-22 NOTE — Telephone Encounter (Signed)
Patient returning Jasmine's call.  

## 2013-07-22 NOTE — Telephone Encounter (Signed)
Patient notified see labs 

## 2013-08-28 ENCOUNTER — Telehealth: Payer: Self-pay | Admitting: Gynecology

## 2013-08-28 NOTE — Telephone Encounter (Signed)
07/19/13 #4 packs/2 refills was [facility] patient is wanting rf's sent to [facility]. I told patient she can call [facility] and have them transfer rf's to [facility] patient agreeable and aware.

## 2013-08-28 NOTE — Telephone Encounter (Signed)
Patient is requesting refills of microgestin . [facility] [phone] Patient changing pharmacies due to current pharmacy does not carry this prescription.

## 2013-09-10 ENCOUNTER — Telehealth: Payer: Self-pay | Admitting: Gynecology

## 2013-09-10 NOTE — Telephone Encounter (Signed)
Patient just said she wants to see dr lathrop would not say what for. i told her i could not schedule an appt if i didn't know what was wrong that i would have the nurse call her. She stated she couldn't talk about it and just that she was in pain and wanted to see dr lathrop.

## 2013-09-10 NOTE — Telephone Encounter (Signed)
Spoke with pt about urinary frequency, and "internal burning" she has had for about a week. Pt thinks it could be a UTI. Offered pt OV today with TL, but pt declined, saying her boss was out today and she couldn't leave work. Pt requests OV Friday afternoon, as she has a half day then. Sched appt with DL at 6:44, but urged pt to call back for earlier appt if symptoms worsen. Advised pt she could try AZO OTC and cranberry pills, as well as a warm sitz bath for comfort. Pt says she will try these and call back if necessary.

## 2013-09-13 ENCOUNTER — Encounter: Payer: Self-pay | Admitting: Certified Nurse Midwife

## 2013-09-13 ENCOUNTER — Ambulatory Visit (INDEPENDENT_AMBULATORY_CARE_PROVIDER_SITE_OTHER): Payer: 59 | Admitting: Certified Nurse Midwife

## 2013-09-13 VITALS — BP 98/60 | HR 64 | Temp 98.7°F | Resp 16 | Ht 64.25 in | Wt 143.0 lb

## 2013-09-13 DIAGNOSIS — N898 Other specified noninflammatory disorders of vagina: Secondary | ICD-10-CM

## 2013-09-13 DIAGNOSIS — N390 Urinary tract infection, site not specified: Secondary | ICD-10-CM

## 2013-09-13 DIAGNOSIS — Z0189 Encounter for other specified special examinations: Secondary | ICD-10-CM

## 2013-09-13 LAB — POCT URINALYSIS DIPSTICK
Bilirubin, UA: NEGATIVE
Glucose, UA: NEGATIVE
Ketones, UA: NEGATIVE

## 2013-09-13 LAB — POCT URINE PREGNANCY: Preg Test, Ur: NEGATIVE

## 2013-09-13 MED ORDER — CIPROFLOXACIN HCL 500 MG PO TABS: 500.0000 mg | ORAL_TABLET | Freq: Two times a day (BID) | ORAL | Status: DC

## 2013-09-13 MED ORDER — PHENAZOPYRIDINE HCL 200 MG PO TABS: 200.0000 mg | ORAL_TABLET | Freq: Three times a day (TID) | ORAL | Status: DC | PRN

## 2013-09-13 NOTE — Progress Notes (Signed)
S:  37 y.o.Single African American female presents with UTI  symptoms of urinary frequency, urinary urgency, suprapubic pain and burning on urination. . Onset of symptoms off/on for 3 weeks, with progressively increased. Has increased water and cranberry juice with no change. No STD concerns. Denies vaginal itching, burning or pain.  The patient is having no constitutional symptoms, denying fever, chills, anorexia, or weight loss..  Symptoms related to post coital No Current method of birth control OCP with consistent use and no missed pills.. Same partner without change. Patient has never had a UTI that she is aware. No new personal products  ROS: pertinent to exam  O alert, oriented to person, place, and time   healthy,  not in acute distress, well developed and well nourished  Skin: warm and dry  CVAT slight positive on left  Abdomen: positive suprapubic Pelvic exam: External genital normal no lesions  Bladder, urethra and urethral meatus all tender  Vagina: normal , with scant watery discharge, no odor    Ph 4.0  Wet prep taken  Cervix: non tender, normal appearance  Uterus: normal, non tender, normal size  Adnexa: non tender no masses palpated  Perineal: no lesions   Wet Prep: negative  Poct urine-rbc trace UPT: neg Assessment: UTI symptomatic P: Reviewed findings with patient of UTI. Rx Cipro see order Rx Pyridium see order Lab: Urine culture  Maintain adequate hydration. Follow up if symptoms not improving, and as needed. Lab:TOC if Urine Culture is positive   RV prn, as above

## 2013-09-15 LAB — URINE CULTURE: Colony Count: 10000

## 2013-09-16 ENCOUNTER — Telehealth: Payer: Self-pay

## 2013-09-16 NOTE — Telephone Encounter (Signed)
Patient notified of results.

## 2013-09-16 NOTE — Telephone Encounter (Signed)
Message copied by Eliezer Bottom on Mon Sep 16, 2013  9:31 AM ------      Message from: Verner Chol      Created: Sun Sep 15, 2013  9:27 PM       Notify culture no hight growth complete medication she is on      Patient status      No TOC per culture, unless patient still symptomatic ------

## 2013-09-16 NOTE — Telephone Encounter (Signed)
Returning call.

## 2013-09-16 NOTE — Telephone Encounter (Signed)
lmtcb

## 2013-09-17 NOTE — Progress Notes (Signed)
Reviewed personally.  M. Suzanne Giovanna Kemmerer, MD.  

## 2014-01-06 ENCOUNTER — Encounter: Payer: Self-pay | Admitting: Gynecology

## 2014-01-06 ENCOUNTER — Ambulatory Visit (INDEPENDENT_AMBULATORY_CARE_PROVIDER_SITE_OTHER): Payer: 59 | Admitting: Gynecology

## 2014-01-06 VITALS — BP 102/64 | HR 60 | Resp 16 | Ht 64.0 in | Wt 139.0 lb

## 2014-01-06 DIAGNOSIS — E221 Hyperprolactinemia: Secondary | ICD-10-CM

## 2014-01-06 DIAGNOSIS — Z01419 Encounter for gynecological examination (general) (routine) without abnormal findings: Secondary | ICD-10-CM

## 2014-01-06 DIAGNOSIS — N808 Other endometriosis: Secondary | ICD-10-CM

## 2014-01-06 DIAGNOSIS — E229 Hyperfunction of pituitary gland, unspecified: Secondary | ICD-10-CM

## 2014-01-06 DIAGNOSIS — Z0189 Encounter for other specified special examinations: Secondary | ICD-10-CM

## 2014-01-06 DIAGNOSIS — Z0000 Encounter for general adult medical examination without abnormal findings: Secondary | ICD-10-CM

## 2014-01-06 DIAGNOSIS — N9489 Other specified conditions associated with female genital organs and menstrual cycle: Secondary | ICD-10-CM

## 2014-01-06 DIAGNOSIS — Z309 Encounter for contraceptive management, unspecified: Secondary | ICD-10-CM

## 2014-01-06 DIAGNOSIS — R102 Pelvic and perineal pain: Secondary | ICD-10-CM

## 2014-01-06 LAB — POCT URINALYSIS DIPSTICK
BILIRUBIN UA: NEGATIVE
GLUCOSE UA: NEGATIVE
Ketones, UA: NEGATIVE
Nitrite, UA: NEGATIVE
Protein, UA: NEGATIVE
RBC UA: NEGATIVE
Urobilinogen, UA: NEGATIVE
pH, UA: 5

## 2014-01-06 LAB — POCT URINE PREGNANCY: Preg Test, Ur: NEGATIVE

## 2014-01-06 MED ORDER — FLUCONAZOLE 150 MG PO TABS: 150.0000 mg | ORAL_TABLET | Freq: Once | ORAL | Status: DC

## 2014-01-06 MED ORDER — CABERGOLINE 0.5 MG PO TABS: 0.2500 mg | ORAL_TABLET | ORAL | Status: DC

## 2014-01-06 MED ORDER — NORETHINDRONE ACET-ETHINYL EST 1-20 MG-MCG PO TABS: 1.0000 | ORAL_TABLET | Freq: Every day | ORAL | Status: DC

## 2014-01-06 NOTE — Progress Notes (Signed)
38 y.o. single @Hispanic  female   No obstetric history on file. here for annual exam. Pt is currently sexually active.  Pt is currently taking Augmentin for URTI and has a history of yeast infections.  Pt reports cycles are not coming on ocp but has not missed pills.  Is taking dostinex for hyperprolactinemia. Pt reports some burning with sex, but is using condoms, unsure if lubricated, no sex in over 106m, broke up.  Pt is feeling suprapubic pressure for 40m.  Patient's last menstrual period was 10/03/2013.          Sexually active: yes  The current method of family planning is OCP (estrogen/progesterone).    Exercising: yes  spin & weights Last pap: 04-10-12 neg HPV HR neg Alcohol: 1-2 a month Tobacco: none BSE: not done Poct urine-wbc 2+, UPT-neg    Health Maintenance  Topic Date Due  . Pap Smear  08/25/1993  . Influenza Vaccine  05/03/2014  . Tetanus/tdap  11/17/2015    Family History  Problem Relation Age of Onset  . Cancer Maternal Grandfather   . Cancer Paternal Grandmother     Patient Active Problem List   Diagnosis Date Noted  . Endometriosis   . Polycystic ovarian syndrome   . Hyperprolactinemia   . Migraine aura without headache   . Cystitis, interstitial   . GOITER, UNSPECIFIED 03/23/2009  . CALF PAIN, LEFT 03/23/2009  . ACUTE CYSTITIS 11/04/2008  . SINUSITIS- ACUTE-NOS 08/21/2008  . HYPERLIPIDEMIA 11/20/2007  . ANEMIA, IRON DEFICIENCY 11/20/2007  . COLITIS, ULCERATIVE NOS 07/23/2007  . ENDOMETRIOSIS NEC 07/23/2007  . PAIN IN JOINT, UNSPECIFIED SITE 07/23/2007  . MYALGIA 07/23/2007  . Abdominal  Pain, Other Specified Site 07/23/2007    Past Medical History  Diagnosis Date  . Endometriosis   . Polycystic ovarian syndrome   . Hyperprolactinemia   . Colitis, ulcerative chronic     Dr Terance Hart  . Migraine aura without headache   . Cystitis, interstitial     Past Surgical History  Procedure Laterality Date  . Appendectomy    . Diagnostic laparoscopy       Allergies: Review of patient's allergies indicates no known allergies.  Current Outpatient Prescriptions  Medication Sig Dispense Refill  . acetaminophen (TYLENOL) 500 MG tablet Take 1,000 mg by mouth every 6 (six) hours as needed for pain.      Marland Kitchen amoxicillin-clavulanate (AUGMENTIN) 875-125 MG per tablet 2 (two) times daily.      Marland Kitchen aspirin 325 MG tablet Take 325 mg by mouth as needed for pain.      . cabergoline (DOSTINEX) 0.5 MG tablet Take 0.5 tablets (0.25 mg total) by mouth 2 (two) times a week.  12 tablet  2  . CALCIUM PO Take by mouth. 4 a day      . Cholecalciferol (VITAMIN D-3) 5000 UNITS TABS Take by mouth daily.       Marland Kitchen CRANBERRY PO Take by mouth. 2 a day      . Lactobacillus (ACIDOPHILUS PO) Take by mouth. 2 a day      . MICROGESTIN 1-20 MG-MCG tablet        No current facility-administered medications for this visit.    ROS: Pertinent items are noted in HPI.  Exam:    BP 102/64  Pulse 60  Resp 16  Ht 5\' 4"  (1.626 m)  Wt 139 lb (63.05 kg)  BMI 23.85 kg/m2  LMP 10/03/2013 Weight change: @WEIGHTCHANGE @ Last 3 height recordings:  Ht Readings from Last 3  Encounters:  01/06/14 5\' 4"  (1.626 m)  09/13/13 5' 4.25" (1.632 m)  07/19/13 5' 4.25" (1.632 m)   General appearance: alert, cooperative and appears stated age Head: Normocephalic, without obvious abnormality, atraumatic Neck: no adenopathy, no carotid bruit, no JVD, supple, symmetrical, trachea midline and thyroid not enlarged, symmetric, no tenderness/mass/nodules Lungs: clear to auscultation bilaterally Breasts: normal appearance, no masses or tenderness Heart: regular rate and rhythm, S1, S2 normal, no murmur, click, rub or gallop Abdomen: soft, non-tender; bowel sounds normal; no masses,  no organomegaly Extremities: extremities normal, atraumatic, no cyanosis or edema Skin: Skin color, texture, turgor normal. No rashes or lesions Lymph nodes: Cervical, supraclavicular, and axillary nodes normal. no  inguinal nodes palpated Neurologic: Grossly normal   Pelvic: External genitalia:  no lesions              Urethra: normal appearing urethra with no masses, tenderness or lesions              Bartholins and Skenes: normal                 Vagina: copious thin white, tender              Cervix: normal appearance              Pap taken: no        Bimanual Exam:  Uterus:  uterus is normal size, shape, consistency and nontender                                      Adnexa:    no masses                                      Rectovaginal: Confirms                                      Anus:  normal sphincter tone, no lesions  A: well woman Contraceptive management Hyperprolactinemia vaginitis     P: counseled on breast self exam, Refill ocp, dostinex PRL level today Urine culture for pressure, consider PUS if culture neg and pressure continues Will treat for yeast due to antibiotics-diflucan sent in  return annually or prn   An After Visit Summary was printed and given to the patient.

## 2014-01-06 NOTE — Patient Instructions (Signed)

## 2014-01-07 LAB — PROLACTIN: Prolactin: 8.8 ng/mL

## 2014-01-08 LAB — URINE CULTURE
COLONY COUNT: NO GROWTH
ORGANISM ID, BACTERIA: NO GROWTH

## 2014-01-10 ENCOUNTER — Telehealth: Payer: Self-pay | Admitting: Emergency Medicine

## 2014-01-10 ENCOUNTER — Telehealth: Payer: Self-pay | Admitting: Gynecology

## 2014-01-10 NOTE — Telephone Encounter (Signed)
Message copied by Michele Mcalpine on Fri Jan 10, 2014  2:01 PM ------      Message from: Elveria Rising      Created: Fri Jan 10, 2014  7:27 AM       Inform urine culture negative, recommend u/s due to history of endometriosis and pressure ------

## 2014-01-10 NOTE — Telephone Encounter (Signed)
Spoke with patient and message from Dr. Charlies Constable given. Verbalized understanding. She is agreeable to schedule pelvic US. Pelvic U/S scheduled and patient aware/agreeable to time. Scheduled for 01/21/14 with Dr. Charlies Constable. Patient verbalized understanding of the U/S appointment cancellation policy. Advised will need to cancel within 72 business hours (3 business days) or will have $100.00 no show fee placed to account.

## 2014-01-10 NOTE — Telephone Encounter (Signed)
Patient canceled her upcoming PUS appointment 01/21/14. Patient did not wish to reschedule.

## 2014-01-10 NOTE — Addendum Note (Signed)
Addended by: Elveria Rising on: 01/10/2014 07:26 AM   Modules accepted: Orders

## 2014-01-10 NOTE — Telephone Encounter (Signed)
Spoke with patient. Advised that per the quote received from her insurance company, her liability for PUS will be $465.96. Patient states that she may have to reschedule due to the amount that she will have to pay. She will check and will call us back.

## 2014-01-14 ENCOUNTER — Other Ambulatory Visit: Payer: Self-pay | Admitting: Gynecology

## 2014-01-21 ENCOUNTER — Other Ambulatory Visit: Payer: 59 | Admitting: Gynecology

## 2014-01-21 ENCOUNTER — Other Ambulatory Visit: Payer: 59

## 2014-02-08 ENCOUNTER — Other Ambulatory Visit: Payer: Self-pay | Admitting: Gynecology

## 2014-02-10 ENCOUNTER — Telehealth: Payer: Self-pay | Admitting: Gynecology

## 2014-02-10 ENCOUNTER — Other Ambulatory Visit: Payer: Self-pay | Admitting: Gynecology

## 2014-02-10 DIAGNOSIS — Z309 Encounter for contraceptive management, unspecified: Secondary | ICD-10-CM

## 2014-02-10 MED ORDER — LEVONORGESTREL-ETHINYL ESTRAD 0.1-20 MG-MCG PO TABS: 1.0000 | ORAL_TABLET | Freq: Every day | ORAL | Status: DC

## 2014-02-10 NOTE — Telephone Encounter (Signed)
Pt would like to speak with nurse regarding her birth control.

## 2014-02-10 NOTE — Telephone Encounter (Signed)
We can try alesse, same estrogen dose but a different progestin, maybe that will be better for her, she can start with new pack

## 2014-02-10 NOTE — Telephone Encounter (Signed)
Spoke with patient. Patient states that she is currently taking Microgestin but is thinking about switching back to Junel. Patient states that she was having hair loss and increased acne with Junel which is why she switched to Microgestin. Now she is having even more hair loss and acne. "I do not know if the dose is not high enough or I need to switch back. I know she wants to keep me on a low dose and I am not sure if there are any other options of birth control I could take." Advised would send a message to Dr.Lathrop and give patient a call back with further instructions and advice. Patient agreeable.

## 2014-02-11 NOTE — Telephone Encounter (Signed)
Spoke with patient. Message from [place] given as seen below. Patient agreeable. Patient is due to start new pack of pills this Sunday and will begin taking Alesse at that time.   Routing to provider for final review. Patient agreeable to disposition. Will close encounter

## 2014-03-06 ENCOUNTER — Telehealth: Payer: Self-pay | Admitting: Gynecology

## 2014-03-06 NOTE — Telephone Encounter (Signed)
Patient called at lunch  Said she had some questions about her birthcontrol.

## 2014-03-06 NOTE — Telephone Encounter (Signed)
Spoke with patient. Patient states that when she was taking microgestin Dr.Lathrop had her taking 1 full pack of 21 days then taking 3 active pills from a new pack then off 4 days and then starting with a new pack again. This had the patient taking 24 active pills with four days off and then beginning a new pack. Patient has recently switched to taking alesse and would like to know if she should continue with this method in which case she will need an extra pack of birth control sent to pharmacy or if she should just take the pack like normal including the placebo pills and then start a new pack. Advised patient would send a message over to the covering provider as Dr.Lathrop is out of the office today and give patient a call back with further instructions. Patient agreeable.  Routing to [place] as covering CC: Dr.Lathrop

## 2014-03-07 MED ORDER — LEVONORGESTREL-ETHINYL ESTRAD 0.1-20 MG-MCG PO TABS: 1.0000 | ORAL_TABLET | Freq: Every day | ORAL | Status: DC

## 2014-03-07 NOTE — Telephone Encounter (Signed)
I  Would do the extra pills, I will send in the additional pack, I added an extra pack every 41m so she can have enough

## 2014-03-07 NOTE — Telephone Encounter (Signed)
Spoke with patient. Advised of message from Dr.Lathrop as seen below. Patient agreeable and verbalizes understanding.  Routing to provider for final review. Patient agreeable to disposition. Will close encounter

## 2014-03-18 ENCOUNTER — Telehealth: Payer: Self-pay | Admitting: Gynecology

## 2014-03-18 NOTE — Telephone Encounter (Signed)
Patient's birth control, Mindy Lowe, is out of stock in all the pharmacies she and her pharmacy have checked with, including mail order. The patient will need another alternative, even though she is happy with the [place].

## 2014-03-18 NOTE — Telephone Encounter (Signed)
Dr.Lathrop, patient is currently on alesse 0.1-20. Patient takes 24 active pills per month with 4 days off and then begins a new pack. Patient is calling stating that all pharmacies in her area and mail order are out of the current rx. Patient would like to switch birth control due to supply. Please review and advise.

## 2014-03-20 MED ORDER — LEVONORGEST-ETH ESTRAD 91-DAY 0.1-0.02 & 0.01 MG PO TABS: 1.0000 | ORAL_TABLET | Freq: Every day | ORAL | Status: DC

## 2014-03-20 NOTE — Telephone Encounter (Signed)
Patient calling to check on the status of the call.  ° °

## 2014-03-20 NOTE — Telephone Encounter (Signed)
Spoke with patient. Advised of message from Dr.Lathrop as seen below. Patient is agreeable. Patient states that she has already started taking the new pack of OCP and has three weeks left. States insurance wont cover her to pick up another pack until the end of the three weeks. Advised patient okay to complete current pack and start with new OCP when she would start the new pack of her old birth control. Patient is agreeable and verbalizes understanding.  Routing to Loretto Belinsky for final review. Patient agreeable to disposition. Will close encounter

## 2014-03-20 NOTE — Telephone Encounter (Signed)
Let's change her to Mindy Lowe, same rx, less placebos, i did n't start there in case she did not tolerate the new med, but it is the best option, she will be on ocp for 12w, off 1w, pls see if she is agreeble

## 2014-03-28 ENCOUNTER — Encounter: Payer: Self-pay | Admitting: Gynecology

## 2014-04-20 ENCOUNTER — Other Ambulatory Visit: Payer: Self-pay | Admitting: Gynecology

## 2014-04-21 NOTE — Telephone Encounter (Signed)
Last AEX: 01/06/14 Last refill:01/06/14 #12, 3 rf Current AEX: 01/14/15  Pt has enough refills to cover until her next AEX Encounter closed

## 2014-08-04 ENCOUNTER — Telehealth: Payer: Self-pay | Admitting: Gynecology

## 2014-08-04 NOTE — Telephone Encounter (Signed)
Patient calling requesting to speak with nurse about her birth control and her cycles. She also reports she has a rash on her chest and stomach she thinks may be related.

## 2014-08-04 NOTE — Telephone Encounter (Signed)
Spoke with patient. Patient states that she is taking seasonique and is now on her 4th or 5th month. "I bleed every month and have cramping on this medication. The bleeding lasts around two days but it is uncomfortable." Patient also states that she has been getting a "rash on my chest and stomach every time I have my cycle." States that this started with new OCP. Patient was previously on [place] and states that she was not having any problems but had to switch because all pharmacies were out of stock of it at the time. Patient would like to make a birth control change at this time. States that she may have switched fabric softener over the last couple of months as well. "That could be causing my rash because is keeps coming back even with using cortisone cream." Denies itching with rash or spreading of rash. Advised patient will send message to [place] regarding symptoms and request to change OCP and return call with further recommendations. Patient is agreeable.

## 2014-08-06 MED ORDER — LEVONORGESTREL-ETHINYL ESTRAD 0.1-20 MG-MCG PO TABS: 1.0000 | ORAL_TABLET | Freq: Every day | ORAL | Status: DC

## 2014-08-06 NOTE — Telephone Encounter (Signed)
Returning call.

## 2014-08-06 NOTE — Telephone Encounter (Signed)
Called the pharmacy on file for her.  They have Lessina in stock.  It is the same as [place].  There are three or four other generics for Lutera that are exactly the same.  If a pharmacy is out of one, they ususally have one of the other generics.  Since she did so well on the [place], I just sent in an rx for Lessina.  I think she may skip placebo pills so I ordered it that way as a 90 day supply which means they should fille 4 packs if her insurance allows this.  Please let me know if she has any other questions.

## 2014-08-06 NOTE — Telephone Encounter (Signed)
Pt is returning a call to [facility]

## 2014-08-06 NOTE — Telephone Encounter (Signed)
Spoke with patient. Advised patient of message as seen below from Dr.Miller. Patient is agreeable and verbalizes understanding.  Routing to provider for final review. Patient agreeable to disposition. Will close encounter   

## 2014-08-06 NOTE — Telephone Encounter (Signed)
Left message to call [place] Lasseter at [phone]. 

## 2014-08-06 NOTE — Telephone Encounter (Signed)
Left message to call Kaitlyn at [phone]. 

## 2014-08-11 ENCOUNTER — Telehealth: Payer: Self-pay | Admitting: Gynecology

## 2014-08-11 NOTE — Telephone Encounter (Signed)
Patient has some follow up questions for the nurse about the RX below and how she is to take it. She only got one pack from the pharmacy and is not sure that is what was prescribed for how she is supposed take it.  levonorgestrel-ethinyl estradiol (AVIANE,ALESSE,LESSINA) 0.1-20 MG-MCG tablet  Take 1 tablet by mouth daily. Takes continuous active pills. 90 day supply is 4 packs, Starting 08/06/2014, Until Discontinued, Normal, Last Dose: Not Recorded  Refills: 2 ordered Pharmacy: [id number] [place], [place]

## 2014-08-11 NOTE — Telephone Encounter (Signed)
Spoke with patient. [facility]. Her current insurance is only able to cover one pack per month. Pharmacist changed order for pack to state it will last the patient 21 days so that she will be able to pick up new pack early to take continuously. Patient is agreeable and verbalizes understanding.   Routing to provider for final review. Patient agreeable to disposition. Will close encounter

## 2014-08-11 NOTE — Telephone Encounter (Signed)
Spoke with patient. Patient states that she picked up her rx for her birth control at the pharmacy and only received one packet. "I only got one packet and it has the three weeks and then one week of placebo pills. I thought it was supposed to be a three month pack." Advised patient pack comes in one month at a time but it was written so she could get 3 months at a time. This way she will take the first three weeks of the pill, skip the placebo week, start the new pack and continue this until the third pill pack where she will take the placebo week to have her cycle. Patient is agreeable. "I only got one pack though." Advised patient may be due to insurance coverage. Will need to speak with pharmacy to verify if any changes need to be made to rx order and will return call.

## 2015-01-12 ENCOUNTER — Ambulatory Visit: Payer: 59 | Admitting: Gynecology

## 2015-01-14 ENCOUNTER — Ambulatory Visit: Payer: 59 | Admitting: Certified Nurse Midwife

## 2015-01-14 ENCOUNTER — Ambulatory Visit: Payer: 59 | Admitting: Gynecology

## 2015-01-26 ENCOUNTER — Telehealth: Payer: Self-pay | Admitting: Nurse Practitioner

## 2015-01-26 NOTE — Telephone Encounter (Signed)
Pt states she would like to change her birth control due to side effects. Former Location manager pt. Pt scheduled for aex 03/24/15 with Patty.

## 2015-01-26 NOTE — Telephone Encounter (Signed)
Left message to call Fantasy Donald at [phone]. 

## 2015-01-26 NOTE — Telephone Encounter (Signed)
She can try Yasmin which is drospirone.  The norgestimate is Ortho tri cyclen and she may have other symptoms with a triphasic pill and the dose is higher than what she is on.  The chlormadinone is not made in the [place] and we are not going to give this per Dr. Quincy Simmonds.  Have her try Yasmin and will see how she does after 3 months RX.

## 2015-01-26 NOTE — Telephone Encounter (Signed)
Spoke with patient. Patient states that she is currently taking Orsythia for birth control. Is in her fourth month and is having really bad acne. Was seen with dermatologist who recommends that she change her birth control. Dermtologist recommends that patient be on a birth control in the class of drospirenone, norgestimate, or chlormadinone. "I was on a birth control in the past that really worked well for me but the pharmacy did not have it in [place] so I had to switch. I do not know if it was in the group that she recommends." Patient was previously on Alesse with no problems. "I have also been having more lower abdominal cramping for a week and I feel bloated. I feel like it is from the birth control." Denies any change in bowel movements, and urinary symptoms, fevers, and chills. Advised will speak with provider and return call with further recommendations. Patient is agreeable. Last aex 01/06/2014 with Dr.Lathrop. Next aex scheduled for 03/24/2015 with Milford Cage, FNP.

## 2015-01-27 MED ORDER — DROSPIRENONE-ETHINYL ESTRADIOL 3-0.03 MG PO TABS: ORAL_TABLET | ORAL | Status: DC

## 2015-01-27 NOTE — Telephone Encounter (Signed)
Spoke with patient. Patient is agreeable and verbalizes understanding. Patient would like to try Yasmin at this time. Yasmin #4 0RF sent to pharmacy on file. Patient takes continuous active pills for 4 packs is 90 day supply.  Routing to provider for final review. Patient agreeable to disposition. Will close encounter

## 2015-01-29 ENCOUNTER — Other Ambulatory Visit: Payer: Self-pay | Admitting: *Deleted

## 2015-01-29 NOTE — Telephone Encounter (Signed)
Medication refill request: Cabergoline 0.5 mg  Last AEX:  01/06/14 with TL  Next AEX: 03/24/15 with PG  Last MMG (if hormonal medication request): N/A Refill authorized: Please advise.

## 2015-01-30 MED ORDER — CABERGOLINE 0.5 MG PO TABS: 0.2500 mg | ORAL_TABLET | ORAL | Status: DC

## 2015-02-11 ENCOUNTER — Telehealth: Payer: Self-pay | Admitting: *Deleted

## 2015-02-11 NOTE — Telephone Encounter (Signed)
Received referral from [facility] for pt to see Dr. Jana Hakim, but no reason why.  Missing radiology reports as well as pathology.  Left message for Sharee Pimple at [facility] to call me back w/ more information and to fax records requested.

## 2015-02-23 ENCOUNTER — Telehealth: Payer: Self-pay | Admitting: *Deleted

## 2015-02-23 NOTE — Telephone Encounter (Signed)
Left message for Sharee Pimple at [facility] on a status update on if we are to schedule this pt.

## 2015-02-23 NOTE — Telephone Encounter (Signed)
[name] at [facility] called pt stating that the pt needs to be seen for [place].  I sent Tiffany in HIM at [facility] requesting for her to schedule the pt.

## 2015-02-26 ENCOUNTER — Other Ambulatory Visit: Payer: Self-pay | Admitting: Nurse Practitioner

## 2015-02-26 NOTE — Telephone Encounter (Signed)
Medication refill request: Dostinex 0.5 mg  Last AEX:  01/06/14 with TL  Next AEX: 03/24/15 with PG  Last MMG (if hormonal medication request): N/A Refill authorized: Please advise.

## 2015-03-24 ENCOUNTER — Ambulatory Visit: Payer: Self-pay | Admitting: Nurse Practitioner

## 2015-04-14 ENCOUNTER — Telehealth: Payer: Self-pay | Admitting: *Deleted

## 2015-04-14 NOTE — Telephone Encounter (Signed)
Called patient home phone and left patient appointment dates and times  as instructed by patients mother.

## 2015-04-20 ENCOUNTER — Telehealth: Payer: Self-pay | Admitting: Hematology & Oncology

## 2015-04-20 NOTE — Telephone Encounter (Signed)
Returned pt's call and rescheduled to 8/17 for nw pt appt.

## 2015-04-21 ENCOUNTER — Telehealth: Payer: Self-pay

## 2015-04-21 NOTE — Telephone Encounter (Signed)
Confirmed new pt appt for 05/20/15 starting at 3:15, advised to bring medications and insurance information

## 2015-05-04 ENCOUNTER — Encounter: Payer: Self-pay | Admitting: Nurse Practitioner

## 2015-05-04 ENCOUNTER — Ambulatory Visit (INDEPENDENT_AMBULATORY_CARE_PROVIDER_SITE_OTHER): Payer: 59 | Admitting: Nurse Practitioner

## 2015-05-04 VITALS — BP 96/64 | HR 80 | Ht 64.0 in | Wt 148.0 lb

## 2015-05-04 DIAGNOSIS — Z01419 Encounter for gynecological examination (general) (routine) without abnormal findings: Secondary | ICD-10-CM | POA: Diagnosis not present

## 2015-05-04 DIAGNOSIS — E229 Hyperfunction of pituitary gland, unspecified: Secondary | ICD-10-CM

## 2015-05-04 DIAGNOSIS — R7989 Other specified abnormal findings of blood chemistry: Secondary | ICD-10-CM

## 2015-05-04 DIAGNOSIS — Z0000 Encounter for general adult medical examination without abnormal findings: Secondary | ICD-10-CM

## 2015-05-04 DIAGNOSIS — Z113 Encounter for screening for infections with a predominantly sexual mode of transmission: Secondary | ICD-10-CM | POA: Diagnosis not present

## 2015-05-04 LAB — POCT URINALYSIS DIPSTICK
Bilirubin, UA: NEGATIVE
GLUCOSE UA: NEGATIVE
KETONES UA: NEGATIVE
Leukocytes, UA: NEGATIVE
NITRITE UA: NEGATIVE
PH UA: 7
Protein, UA: NEGATIVE
RBC UA: NEGATIVE
Urobilinogen, UA: NEGATIVE

## 2015-05-04 MED ORDER — CABERGOLINE 0.5 MG PO TABS: ORAL_TABLET | ORAL | Status: DC

## 2015-05-04 NOTE — Patient Instructions (Signed)

## 2015-05-04 NOTE — Progress Notes (Signed)
Patient ID: Mindy Lowe, female   DOB: 01/19/1976, 39 y.o.   MRN: [id number] 39 y.o. G0 Single  Hispanic Fe here for annual exam.  Stopped OCP in May secondary to side effects.  Acne was worse, felt fatigued, increase in hair loss.  Menses now at 4-5 days. Some cramps.  Some PMS.  Not SA in over 6 months. Wants STD testing.  Patient's last menstrual period was 04/26/2015 (exact date).          Sexually active: No.  The current method of family planning is none.    Exercising: Yes.    gym and yoga at least 4 times per week Smoker:  no  Health Maintenance: Pap:  04/10/12, Negative with neg HR HPV MMG:  03/06/13, Diagnostic Bilateral with left breast ultrasound, Bi-Rads 2:  Benign Findings, begin routine screening at age 67 TDaP:  10/03/09 Labs:  HB:  PCP    Urine:  Negative     reports that she has never smoked. She does not have any smokeless tobacco history on file. She reports that she drinks alcohol. She reports that she does not use illicit drugs.  Past Medical History  Diagnosis Date  . Endometriosis   . Polycystic ovarian syndrome   . Hyperprolactinemia   . Colitis, ulcerative chronic     Dr Terance Hart  . Migraine aura without headache   . Cystitis, interstitial     Past Surgical History  Procedure Laterality Date  . Appendectomy    . Diagnostic laparoscopy      Current Outpatient Prescriptions  Medication Sig Dispense Refill  . acetaminophen (TYLENOL) 500 MG tablet Take 1,000 mg by mouth every 6 (six) hours as needed for pain.    Marland Kitchen aspirin 325 MG tablet Take 325 mg by mouth as needed for pain.    . cabergoline (DOSTINEX) 0.5 MG tablet TAKE 0.5 TABLETS (0.25 MG TOTAL) BY MOUTH 2 (TWO) TIMES A WEEK. 12 tablet 6  . CALCIUM PO Take by mouth. 4 a day    . Cholecalciferol (VITAMIN D-3) 5000 UNITS TABS Take by mouth daily.     Marland Kitchen CRANBERRY PO Take by mouth. 2 a day    . Ferrous Sulfate (IRON SUPPLEMENT PO) Take 1 tablet by mouth daily.    . Lactobacillus (ACIDOPHILUS PO) Take  by mouth. 2 a day    . Multiple Vitamin (MULTIVITAMIN) tablet Take 1 tablet by mouth daily.     No current facility-administered medications for this visit.    Family History  Problem Relation Age of Onset  . Lung cancer Maternal Grandfather   . Ovarian cancer Paternal Grandmother     then developed pancreatic  . Hyperlipidemia Mother   . Hypertension Mother   . Diabetes Father     ROS:  Pertinent items are noted in HPI.  Otherwise, a comprehensive ROS was negative.  Exam:   BP 96/64 mmHg  Pulse 80  Ht 5\' 4"  (1.626 m)  Wt 148 lb (67.132 kg)  BMI 25.39 kg/m2  LMP 04/26/2015 (Exact Date) Height: 5\' 4"  (162.6 cm) Ht Readings from Last 3 Encounters:  05/04/15 5\' 4"  (1.626 m)  01/06/14 5\' 4"  (1.626 m)  09/13/13 5' 4.25" (1.632 m)    General appearance: alert, cooperative and appears stated age Head: Normocephalic, without obvious abnormality, atraumatic Neck: no adenopathy, supple, symmetrical, trachea midline and thyroid normal to inspection and palpation Lungs: clear to auscultation bilaterally Breasts: normal appearance, no masses or tenderness Heart: regular rate and rhythm Abdomen: soft, non-tender;  no masses,  no organomegaly Extremities: extremities normal, atraumatic, no cyanosis or edema Skin: Skin color, texture, turgor normal. No rashes or lesions Lymph nodes: Cervical, supraclavicular, and axillary nodes normal. No abnormal inguinal nodes palpated Neurologic: Grossly normal   Pelvic: External genitalia:  no lesions              Urethra:  normal appearing urethra with no masses, tenderness or lesions              Bartholin's and Skene's: normal                 Vagina: normal appearing vagina with normal color and discharge, no lesions              Cervix: anteverted              Pap taken: Yes.   Bimanual Exam:  Uterus:  normal size, contour, position, consistency, mobility, non-tender              Adnexa: no mass, fullness, tenderness                Rectovaginal: Confirms               Anus:  normal sphincter tone, no lesions  Chaperone present:  yes  A:  Well Woman with normal exam  Contraceptive with condoms - Off OCP since 02/2015  Hyperprolactinemia  R/O STD's    P:   Reviewed health and wellness pertinent to exam  Pap smear as above  Mammogram is due age 46  Will follow with labs  She is given a refill on Dostinex for a year  Will discuss with Dr. Quincy Simmonds the need to do MRI of brain  Counseled on breast self exam, mammography screening, adequate intake of calcium and vitamin D, diet and exercise return annually or prn  An After Visit Summary was printed and given to the patient.

## 2015-05-05 LAB — STD PANEL
HEP B S AG: NEGATIVE
HIV: NONREACTIVE

## 2015-05-05 LAB — PROLACTIN: Prolactin: 11.3 ng/mL

## 2015-05-06 LAB — IPS N GONORRHOEA AND CHLAMYDIA BY PCR

## 2015-05-07 ENCOUNTER — Ambulatory Visit: Payer: Self-pay | Admitting: Hematology & Oncology

## 2015-05-07 ENCOUNTER — Ambulatory Visit: Payer: Self-pay

## 2015-05-07 ENCOUNTER — Other Ambulatory Visit: Payer: Self-pay

## 2015-05-07 ENCOUNTER — Telehealth: Payer: Self-pay | Admitting: Emergency Medicine

## 2015-05-07 DIAGNOSIS — E221 Hyperprolactinemia: Secondary | ICD-10-CM

## 2015-05-07 NOTE — Telephone Encounter (Signed)
Spoke with patient and message from Milford Cage, FNP and Dr. Quincy Simmonds given.  Dr. Elyse Hsu no longer in practice per patient. She will need referral to new provider. Per Dr. Altheimer's note, patient has seen Dr. Chalmers Cater in the past but patient does not recall this.  Patty do you have recommendation for referral for patient?

## 2015-05-07 NOTE — Telephone Encounter (Signed)
Notes Recorded by Kem Boroughs, FNP on 05/06/2015 at 1:05 PM Please let pt know that STD panel with HIV, Hep B, STS, GC, Chl is all negative. The prolactin level is normal and continue at same dose for now. I have also had Dr. Quincy Simmonds to review old chart and notes from Dr. Elyse Hsu and she suggest that pt. return to see him in regards to CT head scan for hyperprolactinemia. That has not been done yet to look at the pituitary gland.

## 2015-05-08 LAB — IPS PAP TEST WITH HPV

## 2015-05-08 NOTE — Telephone Encounter (Signed)
Per Dr.Silva Dr. Elyse Hsu is still in practice in [place] - just not at the same office he was associated with before.

## 2015-05-09 NOTE — Progress Notes (Signed)
Encounter reviewed by Dr. Aundria Rud. Paper chart reviewed.  Established patient of Dr. Elyse Hsu for hyperprolactinemia.  Does not appear to have done MRI of pituitary gland as she has had well controlled prolactin level, per his comments and recommendations.

## 2015-05-11 NOTE — Telephone Encounter (Signed)
Referral to Dr. Conley Rolls new office [phone] to attention [facility] referrals coordinator fax number [phone].   [place]

## 2015-05-20 ENCOUNTER — Ambulatory Visit (HOSPITAL_BASED_OUTPATIENT_CLINIC_OR_DEPARTMENT_OTHER): Payer: Self-pay | Admitting: Family

## 2015-05-20 ENCOUNTER — Other Ambulatory Visit (HOSPITAL_BASED_OUTPATIENT_CLINIC_OR_DEPARTMENT_OTHER): Payer: 59

## 2015-05-20 ENCOUNTER — Encounter: Payer: Self-pay | Admitting: Hematology & Oncology

## 2015-05-20 ENCOUNTER — Ambulatory Visit: Payer: 59

## 2015-05-20 VITALS — BP 101/68 | HR 83 | Temp 98.0°F | Resp 18 | Ht 64.0 in | Wt 148.0 lb

## 2015-05-20 DIAGNOSIS — D72819 Decreased white blood cell count, unspecified: Secondary | ICD-10-CM

## 2015-05-20 LAB — CBC WITH DIFFERENTIAL (CANCER CENTER ONLY)
BASO#: 0 10*3/uL (ref 0.0–0.2)
BASO%: 0.6 % (ref 0.0–2.0)
EOS%: 2.1 % (ref 0.0–7.0)
Eosinophils Absolute: 0.1 10*3/uL (ref 0.0–0.5)
HEMATOCRIT: 36.9 % (ref 34.8–46.6)
HGB: 12.8 g/dL (ref 11.6–15.9)
LYMPH#: 2.3 10*3/uL (ref 0.9–3.3)
LYMPH%: 43.3 % (ref 14.0–48.0)
MCH: 33.5 pg (ref 26.0–34.0)
MCHC: 34.7 g/dL (ref 32.0–36.0)
MCV: 97 fL (ref 81–101)
MONO#: 0.5 10*3/uL (ref 0.1–0.9)
MONO%: 9.8 % (ref 0.0–13.0)
NEUT#: 2.4 10*3/uL (ref 1.5–6.5)
NEUT%: 44.2 % (ref 39.6–80.0)
PLATELETS: 253 10*3/uL (ref 145–400)
RBC: 3.82 10*6/uL (ref 3.70–5.32)
RDW: 13.2 % (ref 11.1–15.7)
WBC: 5.3 10*3/uL (ref 3.9–10.0)

## 2015-05-20 LAB — CHCC SATELLITE - SMEAR

## 2015-05-20 NOTE — Progress Notes (Signed)
Hematology/Oncology Consultation   Name: Mindy Lowe      MRN: [id number]    Location: Room/bed info not found  Date: 05/20/2015 Time:4:38 PM   REFERRING PHYSICIAN: Rodena Medin, MD  REASON FOR CONSULT: Leukopenia    DIAGNOSIS: Leukopenia   HISTORY OF PRESENT ILLNESS: Ms. Mindy Lowe is a very pleasant 39 yo female with a recent low WBC count of 3.4. She has had no problem with infections. So far this has not been an issue for her. Her WBC count is back up to 5.3 today.  She denies fever, chills, n/v, cough, rash, dizziness, SOB, chest pain, palpitations, abdominal pain, changes in bowel or bladder habits. She has not noticed an blood in her urine or stool.  No lymphadenopathy fount on exam.  She recently stopped her birth control which she was on for heavy painful cycles. She states that her cycles are now normal last 5 days with the first 2 days being heavy. She does have a history of endometriosis.  She has no children and has never been pregnant,  No family history of cancer, sickle cell disease or trait, anemia or leukopenia.  She states that she was anemic as a child but that resolved years ago.  She is very active going to the gym 4-5 days a week. She eats a healthy diet and stays well hydrated. Her weight is stable. She denies any significant weight loss or gain.  No swelling, tenderness, numbness or tingling in her extremities. No aches or pains.  She is originally from [place] and lived in [place] for a while before moving to [place] 10 years ago. She works in [place] with [employer] and really loves her job.  She had a lump in her left breast last year that was determined to be a swollen lymph node and benign with mammogram. She is due for a mammogram this year and plans to schedule that herself.   ROS: All other 10 point review of systems is negative.   PAST MEDICAL HISTORY:   Past Medical History  Diagnosis Date  . Endometriosis   . Polycystic ovarian syndrome   . Hyperprolactinemia   . Colitis,  ulcerative chronic     Dr Terance Hart  . Migraine aura without headache   . Cystitis, interstitial     ALLERGIES: No Known Allergies    MEDICATIONS:  Current Outpatient Prescriptions on File Prior to Visit  Medication Sig Dispense Refill  . aspirin 325 MG tablet Take 325 mg by mouth as needed for pain.    . cabergoline (DOSTINEX) 0.5 MG tablet TAKE 0.5 TABLETS (0.25 MG TOTAL) BY MOUTH 2 (TWO) TIMES A WEEK. 12 tablet 6  . CALCIUM PO Take by mouth. 4 a day    . Cholecalciferol (VITAMIN D-3) 5000 UNITS TABS Take by mouth daily.     Marland Kitchen CRANBERRY PO Take by mouth. 2 a day    . Ferrous Sulfate (IRON SUPPLEMENT PO) Take 1 tablet by mouth daily.    . Lactobacillus (ACIDOPHILUS PO) Take by mouth. 2 a day    . Multiple Vitamin (MULTIVITAMIN) tablet Take 1 tablet by mouth daily.    Marland Kitchen acetaminophen (TYLENOL) 500 MG tablet Take 1,000 mg by mouth every 6 (six) hours as needed for pain.     No current facility-administered medications on file prior to visit.     PAST SURGICAL HISTORY Past Surgical History  Procedure Laterality Date  . Appendectomy    . Diagnostic laparoscopy      FAMILY HISTORY: Family  History  Problem Relation Age of Onset  . Lung cancer Maternal Grandfather   . Ovarian cancer Paternal Grandmother     then developed pancreatic  . Hyperlipidemia Mother   . Hypertension Mother   . Diabetes Father     SOCIAL HISTORY:  reports that she has never smoked. She does not have any smokeless tobacco history on file. She reports that she drinks alcohol. She reports that she does not use illicit drugs.  PERFORMANCE STATUS: The patient's performance status is 0 - Asymptomatic  PHYSICAL EXAM: Most Recent Vital Signs: Blood pressure 101/68, pulse 83, temperature 98 F (36.7 C), temperature source Oral, resp. rate 18, height 5' 4"  (1.626 m), weight 148 lb (67.132 kg), last menstrual period 04/26/2015. BP 101/68 mmHg  Pulse 83  Temp(Src) 98 F (36.7 C) (Oral)  Resp 18  Ht 5' 4"   (1.626 m)  Wt 148 lb (67.132 kg)  BMI 25.39 kg/m2  LMP 04/26/2015 (Exact Date)  General Appearance:    Alert, cooperative, no distress, appears stated age  Head:    Normocephalic, without obvious abnormality, atraumatic  Eyes:    PERRL, conjunctiva/corneas clear, EOM's intact, fundi    benign, both eyes        Throat:   Lips, mucosa, and tongue normal; teeth and gums normal  Neck:   Supple, symmetrical, trachea midline, no adenopathy;    thyroid:  no enlargement/tenderness/nodules; no carotid   bruit or JVD  Back:     Symmetric, no curvature, ROM normal, no CVA tenderness  Lungs:     Clear to auscultation bilaterally, respirations unlabored  Chest Wall:    No tenderness or deformity   Heart:    Regular rate and rhythm, S1 and S2 normal, no murmur, rub   or gallop     Abdomen:     Soft, non-tender, bowel sounds active all four quadrants,    no masses, no organomegaly        Extremities:   Extremities normal, atraumatic, no cyanosis or edema  Pulses:   2+ and symmetric all extremities  Skin:   Skin color, texture, turgor normal, no rashes or lesions  Lymph nodes:   Cervical, supraclavicular, and axillary nodes normal  Neurologic:   CNII-XII intact, normal strength, sensation and reflexes    throughout   LABORATORY DATA:  Results for orders placed or performed in visit on 05/20/15 (from the past 48 hour(s))  CBC with Differential [facility])     Status: None   Collection Time: 05/20/15  3:23 PM  Result Value Ref Range   WBC 5.3 3.9 - 10.0 10e3/uL   RBC 3.82 3.70 - 5.32 10e6/uL   HGB 12.8 11.6 - 15.9 g/dL   HCT 36.9 34.8 - 46.6 %   MCV 97 81 - 101 fL   MCH 33.5 26.0 - 34.0 pg   MCHC 34.7 32.0 - 36.0 g/dL   RDW 13.2 11.1 - 15.7 %   Platelets 253 145 - 400 10e3/uL   NEUT# 2.4 1.5 - 6.5 10e3/uL   LYMPH# 2.3 0.9 - 3.3 10e3/uL   MONO# 0.5 0.1 - 0.9 10e3/uL   Eosinophils Absolute 0.1 0.0 - 0.5 10e3/uL   BASO# 0.0 0.0 - 0.2 10e3/uL   NEUT% 44.2 39.6 - 80.0 %   LYMPH% 43.3  14.0 - 48.0 %   MONO% 9.8 0.0 - 13.0 %   EOS% 2.1 0.0 - 7.0 %   BASO% 0.6 0.0 - 2.0 %  [facility] - Smear  Status: None   Collection Time: 05/20/15  3:23 PM  Result Value Ref Range   Smear Result Smear Available       RADIOGRAPHY: No results found.     PATHOLOGY: None  ASSESSMENT/PLAN: Ms. Jarema is a very pleasant 39 yo female with a recent low WBC count of 3.4.  So far this has not been an issue for her and WBC count is back up to normal at 5.3 today.  Her WBC differential is good on CBC today. She has had no problem with infections.  Dr. Marin Olp did view her blood smear and did not identify any abnormality or malignancy. He did not feel that a bone marrow biopsy would be necessary.  All questions were answered and we will not need to see her back in our office. She is welcome to contact us with any hematologic questions or concerns in the future. We would be happy to see her again.   She was discussed with and also seen by Dr. Marin Olp and he is in agreement with the aforementioned.   [facility]    Addendum:  I saw and examined the patient with Meeka Cartelli. We looked at her blood smear under the microscope. She had good maturation of her white blood cells. I saw no immature myeloid or lymphoid cells. She has no hypersegmented polys. There are no abnormal red cell phenotypes. I see no target cells. She has no nucleated red cells. Platelets are adequate number and size.  Her exam is fairly unremarkable.  I just don't think that there is any hematologic issues.. Is hard to explain why she had transient leukopenia. I don't see that this would be indicative of any bone marrow disorder.  She does not need a bone marrow biopsy.  She is taking some supplements. I will know if this might be involved.  We reassured her. We reviewed her labs with her. We gave her some "peace of mind".  I just don't think that we have to see her back in the office. I told her that we would be  more and having to see her back in the future if any thing else arises. She does have some other health issues that certainly well need to be followed.  We spent about 45 minutes with her. She is very nice. It was nice talking to her.  Lum Keas

## 2015-06-24 ENCOUNTER — Telehealth: Payer: Self-pay

## 2015-06-24 DIAGNOSIS — E221 Hyperprolactinemia: Secondary | ICD-10-CM

## 2015-06-24 NOTE — Telephone Encounter (Signed)
Left message to call [place] at [phone].   Message     Please call pt. And verify if she is OK with [place] placing the order for MRI as we need this information to be able to manage her medications.        ----- Message -----     From: Nunzio Cobbs, MD     Sent: 06/23/2015  4:01 PM      To: Kem Boroughs, FNP    Subject: RE: altheimer referral                    Hi Patty,         It is probably easier for [place] just to order the MRI of the brain through our office.         Thanks,         [employer]

## 2015-06-26 NOTE — Telephone Encounter (Signed)
Left message to call Kaitlyn at [phone]. 

## 2015-07-03 NOTE — Telephone Encounter (Signed)
Spoke with patient. Advised of message as seen below from Kem Boroughs, [place] and Dr.Silva. Patient is agreeable to proceed with MRI of the brain. Or for MRI placed. Will need precert. Advised she will be contacted directly by [facility] to schedule this appointment. I will continue to follow this is ensure it is scheduled. Patient is agreeable.   Dr.Silva, order for MRI of brain pending your review to ensure proper ordering.

## 2015-07-03 NOTE — Telephone Encounter (Signed)
Order has been placed for MRI brain w/wo contrast for hyperprolactinemia in order to rule out prolactinoma or hypothalamic lesion. This will need to be precerted and Mindy Lowe will contact the patient directly to schedule. I will keep this patient in hold to ensure this is scheduled.  Cc: Theresia Lo

## 2015-07-03 NOTE — Telephone Encounter (Signed)
Thank you for the update!

## 2015-07-03 NOTE — Telephone Encounter (Signed)
I am not able to see the pended order.  This is for an MRI of brain for hyperprolactinemia in order to rule out prolactinoma or hypothalamic lesion.

## 2015-07-06 NOTE — Telephone Encounter (Signed)
Per insurance - prior auth not required based on plan. Contacted [facility] with information.

## 2015-07-24 ENCOUNTER — Ambulatory Visit
Admission: RE | Admit: 2015-07-24 | Discharge: 2015-07-24 | Disposition: A | Discharge status: Home / Self Care | Payer: 59 | Source: Ambulatory Visit | Attending: Obstetrics and Gynecology | Admitting: Obstetrics and Gynecology

## 2015-07-24 DIAGNOSIS — E221 Hyperprolactinemia: Secondary | ICD-10-CM

## 2015-07-24 MED ORDER — GADOBENATE DIMEGLUMINE 529 MG/ML IV SOLN
7.0000 mL | Freq: Once | INTRAVENOUS | Status: AC | PRN
  Administered 2015-07-24: 7 mL via INTRAVENOUS

## 2015-07-28 ENCOUNTER — Telehealth: Payer: Self-pay

## 2015-07-28 DIAGNOSIS — D352 Benign neoplasm of pituitary gland: Secondary | ICD-10-CM

## 2015-07-28 NOTE — Telephone Encounter (Signed)
Spoke with patient. Advised of results as seen below from [place]. Patient is agreeable and verbalizes understanding. Would like to schedule appointment with [facility] at this time. Is available Monday and Friday early morning and late afternoon. Advised I will place a referral and call to schedule this appointment and return call. Patient is agreeable.   Spoke with Colletta Maryland at [facility] appointment scheduled for 10/31 at 3 pm with Dr.Ellison.  Spoke with patient. Patient is agreeable to appointment date and time. Address and telephone number provided to [facility].  Cc: Kem Boroughs, FNP  Routing to provider for final review. Patient agreeable to disposition. Will close encounter.

## 2015-07-28 NOTE — Telephone Encounter (Signed)
-----   Message from Nunzio Cobbs, MD sent at 07/28/2015  7:52 AM EDT ----- Please inform patient of MRI results showing she does have a pituitary microadenoma, a tiny enlargement of the area of the brain producing the extra Prolactin.  This is not cancer.  She does need to be seen by endocrinology for management.  Please refer her to [place].   [place]

## 2015-08-03 ENCOUNTER — Ambulatory Visit (INDEPENDENT_AMBULATORY_CARE_PROVIDER_SITE_OTHER): Payer: 59 | Admitting: Endocrinology

## 2015-08-03 ENCOUNTER — Encounter: Payer: Self-pay | Admitting: Endocrinology

## 2015-08-03 VITALS — BP 106/64 | HR 80 | Temp 98.4°F | Resp 20 | Wt 151.8 lb

## 2015-08-03 DIAGNOSIS — E221 Hyperprolactinemia: Secondary | ICD-10-CM

## 2015-08-03 LAB — TSH: TSH: 1.26 u[IU]/mL (ref 0.35–4.50)

## 2015-08-03 NOTE — Patient Instructions (Addendum)
Please continue the same cabergoline.  Please return in 1 year. It is not essential to recheck the MRI--it is up to you.   Please call if a pregnancy happens, as we would most likely want to stop the caergoline then. blood tests are requested for you today.  We'll let you know about the results.

## 2015-08-03 NOTE — Progress Notes (Signed)
Subjective:    Patient ID: Mindy Lowe, female    DOB: Feb 18, 1976, 39 y.o.   MRN: [id number]  HPI Pt had menarche at age 35.  She has had regular menses, until the past few years. She is G0, but would like to preserve that option for the future.  She has never triad to get pregnant.  She took oral contraceptives from age 28, due to endometriosis, and took x approx 15 years.  She stopped due to irreg bleeding, and she declines to resume.  She was noted to have elevated prolactin in approx 2011, when she was noted to have moderately excessive hair growth on the face, and assoc hair loss on the head.  She was rx'ed carbergoline, but she felt no different since.  She denies the following: excessive exercise, opiates, antipsychotics, brain XRT, brain surgery, cirrhosis,  thyroid dz, and seizures.   Pt denies h/o h/o liver dz, XRT, infertility, seizures, PCO, hirsutism, oral contraceptives, renal dz, zoster, brain injury, or chest wall injury.   Past Medical History  Diagnosis Date  . Endometriosis   . Polycystic ovarian syndrome   . Hyperprolactinemia ([place])   . Colitis, ulcerative chronic ([facility])     Dr Terance Hart  . Migraine aura without headache   . Cystitis, interstitial   . Goiter     Past Surgical History  Procedure Laterality Date  . Appendectomy    . Diagnostic laparoscopy      Social History   Social History  . Marital Status: Single    Spouse Name: N/A  . Number of Children: N/A  . Years of Education: N/A   Occupational History  . Not on file.   Social History Main Topics  . Smoking status: Never Smoker   . Smokeless tobacco: Not on file  . Alcohol Use: 0.0 oz/week    0 Standard drinks or equivalent per week     Comment: socially 1-2 a month  . Drug Use: No  . Sexual Activity:    Partners: Male    Birth Control/ Protection: Pill   Other Topics Concern  . Not on file   Social History Narrative    Current Outpatient Prescriptions on File Prior to Visit    Medication Sig Dispense Refill  . acetaminophen (TYLENOL) 500 MG tablet Take 1,000 mg by mouth every 6 (six) hours as needed for pain.    Marland Kitchen aspirin 325 MG tablet Take 325 mg by mouth as needed for pain.    . cabergoline (DOSTINEX) 0.5 MG tablet TAKE 0.5 TABLETS (0.25 MG TOTAL) BY MOUTH 2 (TWO) TIMES A WEEK. 12 tablet 6  . CALCIUM PO Take by mouth. 4 a day    . Chaste Tree (VITEX EXTRACT PO) Take 1 capsule by mouth every morning.    . Cholecalciferol (VITAMIN D-3) 5000 UNITS TABS Take by mouth daily.     Marland Kitchen CRANBERRY PO Take by mouth. 2 a day    . Ferrous Sulfate (IRON SUPPLEMENT PO) Take 1 tablet by mouth daily.    . Lactobacillus (ACIDOPHILUS PO) Take by mouth. 2 a day    . Multiple Vitamin (MULTIVITAMIN) tablet Take 1 tablet by mouth daily.    . NON FORMULARY      No current facility-administered medications on file prior to visit.    No Known Allergies  Family History  Problem Relation Age of Onset  . Lung cancer Maternal Grandfather   . Ovarian cancer Paternal Grandmother     then developed pancreatic  .  Hyperlipidemia Mother   . Hypertension Mother   . Diabetes Father     BP 106/64 mmHg  Pulse 80  Temp(Src) 98.4 F (36.9 C) (Oral)  Resp 20  Wt 151 lb 12.8 oz (68.856 kg)  SpO2 99%  LMP 07/24/2015  Review of Systems denies polyuria, loss of smell, syncope, rash, depression, headache, visual loss, galactorrhea, easy bruising, rhinorrhea, and n/v.  She has acne.  She reports painful menstruation, weight gain, and acne.      Objective:   Physical Exam VS: see vs page GEN: no distress HEAD: head: no deformity eyes: no periorbital swelling, no proptosis external nose and ears are normal mouth: no lesion seen NECK: supple, thyroid is not enlarged CHEST WALL: no deformity LUNGS:  Clear to auscultation CV: reg rate and rhythm, no murmur. ABD: abdomen is soft, nontender.  no hepatosplenomegaly.  not distended.  no hernia MUSCULOSKELETAL: muscle bulk and strength are  grossly normal.  no obvious joint swelling.  gait is normal and steady EXTEMITIES: no deformity.  no edema PULSES: dorsalis pedis intact bilat.   NEURO:  cn 2-12 grossly intact.   readily moves all 4's.  sensation is intact to touch on the feet. SKIN:  Normal texture and temperature.  No rash or suspicious lesion is visible.   NODES:  None palpable at the neck PSYCH: alert, well-oriented.  Does not appear anxious nor depressed.   Lab Results  Component Value Date   TSH 1.26 08/03/2015   Radiol: MRI: microadenoma  Prolactin=18    Assessment & Plan:  Hyperprolactinemia: well-controlled.  As she wants to preserve the option of a pregnancy, she should continue the same medication.   Pituitary microadenoma: new to me.  in view of the normal prolactin when the MRI was done, the adenoma is probably not related to the prolactin.    Patient is advised the following: Patient Instructions  Please continue the same cabergoline.  Please return in 1 year. It is not essential to recheck the MRI--it is up to you.   Please call if a pregnancy happens, as we would most likely want to stop the caergoline then. blood tests are requested for you today.  We'll let you know about the results.

## 2015-08-12 ENCOUNTER — Telehealth: Payer: Self-pay | Admitting: Emergency Medicine

## 2015-08-12 NOTE — Telephone Encounter (Signed)
Out of imaging hold   

## 2015-08-12 NOTE — Telephone Encounter (Signed)
-----   Message from Nunzio Cobbs, MD sent at 08/11/2015  6:18 PM EST ----- Regarding: RE: Imaging hold  Ok to remove from imaging hold.  Thanks,   [place] ----- Message -----    From: Michele Mcalpine, RN    Sent: 08/11/2015   1:35 PM      To: Nunzio Cobbs, MD Subject: Imaging hold                                   Dr. Quincy Simmonds,  Brain MRI completed and patient referred to endocrinology. Okay to remove from imaging hold?

## 2016-05-04 ENCOUNTER — Ambulatory Visit: Payer: 59 | Admitting: Nurse Practitioner

## 2016-05-14 ENCOUNTER — Other Ambulatory Visit: Payer: Self-pay | Admitting: Nurse Practitioner

## 2016-05-16 NOTE — Telephone Encounter (Signed)
Medication refill request: Cabergoline Last AEX:  05-04-15 Next AEX: 05-30-16 Last MMG (if hormonal medication request): 03-06-13 WNL  Refill authorized: please advise

## 2016-05-30 ENCOUNTER — Ambulatory Visit: Payer: 59 | Admitting: Nurse Practitioner

## 2016-05-30 ENCOUNTER — Telehealth: Payer: Self-pay | Admitting: Nurse Practitioner

## 2016-05-30 NOTE — Telephone Encounter (Signed)
Patient called and canceled/rescheduled her aex today due to work conflict. Patient said she called on Friday while our phone was off and was unable to leave a message on our voicemail. Patient reschedule 06/20/16 at 9:00am.

## 2016-06-20 ENCOUNTER — Ambulatory Visit: Payer: 59 | Admitting: Nurse Practitioner

## 2016-08-01 ENCOUNTER — Ambulatory Visit: Payer: Self-pay | Admitting: Endocrinology

## 2016-10-27 ENCOUNTER — Encounter: Payer: Self-pay | Admitting: Family Medicine

## 2016-10-27 ENCOUNTER — Ambulatory Visit (HOSPITAL_BASED_OUTPATIENT_CLINIC_OR_DEPARTMENT_OTHER)
Admission: RE | Admit: 2016-10-27 | Discharge: 2016-10-27 | Disposition: A | Discharge status: Home / Self Care | Payer: 59 | Source: Ambulatory Visit | Attending: Family Medicine | Admitting: Family Medicine

## 2016-10-27 ENCOUNTER — Ambulatory Visit (INDEPENDENT_AMBULATORY_CARE_PROVIDER_SITE_OTHER): Payer: 59 | Admitting: Family Medicine

## 2016-10-27 VITALS — BP 131/70 | HR 90 | Ht 64.0 in | Wt 155.0 lb

## 2016-10-27 DIAGNOSIS — M545 Low back pain: Secondary | ICD-10-CM

## 2016-10-27 DIAGNOSIS — G8929 Other chronic pain: Secondary | ICD-10-CM | POA: Diagnosis not present

## 2016-10-27 DIAGNOSIS — M5441 Lumbago with sciatica, right side: Secondary | ICD-10-CM

## 2016-10-27 DIAGNOSIS — M5442 Lumbago with sciatica, left side: Secondary | ICD-10-CM | POA: Diagnosis not present

## 2016-10-27 MED ORDER — METHOCARBAMOL 500 MG PO TABS: 500.0000 mg | ORAL_TABLET | Freq: Three times a day (TID) | ORAL | 1 refills | Status: DC | PRN

## 2016-10-27 NOTE — Patient Instructions (Signed)
Your back pain is due to deep lumbar strain or disc herniation. Both are treated similarly initially. I would avoid prednisone dose pack for now. Aleve 2 tabs twice a day with food OR ibuprofen 600mg  three times a day with food for pain and inflammation - take for 7-10 days then as needed. Robaxin as needed for muscle spasms. Stay as active as possible. Physical therapy has been shown to be helpful as well - start this and do home exercises on days you don't go to therapy. Strengthening of low back muscles, abdominal musculature are key for long term pain relief. If not improving, will consider further imaging (MRI). Follow up with me about 1 month to 6 weeks after starting therapy.

## 2016-10-31 DIAGNOSIS — M545 Low back pain, unspecified: Secondary | ICD-10-CM | POA: Insufficient documentation

## 2016-10-31 NOTE — Progress Notes (Signed)
PCP: No primary care provider on file.  Subjective:   HPI: Patient is a 41 y.o. female here for low back pain.  Patient reports about 2 years ago she had problems with her low back. Current pain started about 12/25 and worse than prior pain. Pain 0/10 at rest but up to 10/10 and sharp. Worse with any twisting and bending forwards. If sits for long time both legs will go to sleep. Pain across bilateral lower back. No radiation into legs. No numbness or tingling. Saw chiropractor for 2 years with mild benefit - had radiographs and told was out of alignment. No skin changes.  Past Medical History:  Diagnosis Date  . Colitis, ulcerative chronic (HCC)    Dr Terance Hart  . Cystitis, interstitial   . Endometriosis   . Goiter   . Hyperprolactinemia ([place])   . Migraine aura without headache   . Polycystic ovarian syndrome     Current Outpatient Prescriptions on File Prior to Visit  Medication Sig Dispense Refill  . acetaminophen (TYLENOL) 500 MG tablet Take 1,000 mg by mouth every 6 (six) hours as needed for pain.    Marland Kitchen aspirin 325 MG tablet Take 325 mg by mouth as needed for pain.    . cabergoline (DOSTINEX) 0.5 MG tablet TAKE 0.5 TABLETS (0.25 MG TOTAL) BY MOUTH 2 (TWO) TIMES A WEEK. 60 tablet 0  . CALCIUM PO Take by mouth. 4 a day    . Chaste Tree (VITEX EXTRACT PO) Take 1 capsule by mouth every morning.    . Cholecalciferol (VITAMIN D-3) 5000 UNITS TABS Take by mouth daily.     Marland Kitchen CRANBERRY PO Take by mouth. 2 a day    . Ferrous Sulfate (IRON SUPPLEMENT PO) Take 1 tablet by mouth daily.    . Lactobacillus (ACIDOPHILUS PO) Take by mouth. 2 a day    . Multiple Vitamin (MULTIVITAMIN) tablet Take 1 tablet by mouth daily.    . NON FORMULARY      No current facility-administered medications on file prior to visit.     Past Surgical History:  Procedure Laterality Date  . APPENDECTOMY    . DIAGNOSTIC LAPAROSCOPY      Allergies  Allergen Reactions  . Codeine Other (See Comments)     Social History   Social History  . Marital status: Single    Spouse name: N/A  . Number of children: N/A  . Years of education: N/A   Occupational History  . Not on file.   Social History Main Topics  . Smoking status: Never Smoker  . Smokeless tobacco: Never Used  . Alcohol use 0.0 oz/week     Comment: socially 1-2 a month  . Drug use: No  . Sexual activity: Yes    Partners: Male    Birth control/ protection: Pill   Other Topics Concern  . Not on file   Social History Narrative  . No narrative on file    Family History  Problem Relation Age of Onset  . Lung cancer Maternal Grandfather   . Ovarian cancer Paternal Grandmother     then developed pancreatic  . Hyperlipidemia Mother   . Hypertension Mother   . Diabetes Father     BP 131/70   Pulse 90   Ht 5\' 4"  (1.626 m)   Wt 155 lb (70.3 kg)   LMP 10/06/2016   BMI 26.61 kg/m   Review of Systems: See HPI above.     Objective:  Physical Exam:  Gen: NAD,  comfortable in exam room  Back: No gross deformity, scoliosis. TTP bilateral paraspinal lumbar regions.  No midline or bony TTP. FROM with pain on flexion, bilateral trunk rotation. Strength LEs 5/5 all muscle groups.   2+ MSRs in patellar and achilles tendons, equal bilaterally. Negative SLRs. Sensation intact to light touch bilaterally. Negative logroll bilateral hips Negative fabers and piriformis stretches.   Assessment & Plan:  1. Low back pain - independently reviewed radiographs and no evidence abnormalities.  Reassured patient.  2/2 lumbar strain vs central disc herniation.  Start with regular aleve or ibuprofen, robaxin if needed.  Start physical therapy and home exercise program.  F/u in 1 month to 6 weeks for reevaluation.

## 2016-10-31 NOTE — Assessment & Plan Note (Signed)
independently reviewed radiographs and no evidence abnormalities.  Reassured patient.  2/2 lumbar strain vs central disc herniation.  Start with regular aleve or ibuprofen, robaxin if needed.  Start physical therapy and home exercise program.  F/u in 1 month to 6 weeks for reevaluation.

## 2017-10-30 NOTE — Progress Notes (Signed)
[facility], [place] [address] Phone: [phone] Subjective:     CC: Low back pain  [id number]  Mindy Lowe is a 42 y.o. female coming in with complaint of lower back pain. She does have pain that radiates up into her neck. She has had some sharp pain recently in the coccyx region. She has been having pain for 2 years which started after working out and dancing. She is able to workout. Has tried chiropractic care, heat and IBU and aspirin.  Patient states that sometimes when she does do certain lifting he can radiate down her legs or up towards her neck.  Patient rates the severity of pain when that occurs is not started to increase activity.  Does notice tightness when she sits a lot at work but does have an adjustable standing desk at this point.    Patient did have x-rays of the lumbar spine taken October 27, 2016.  These were independently visualized by me showing no significant bony abnormality.  Past Medical History:  Diagnosis Date  . Colitis, ulcerative chronic (HCC)    Dr Terance Hart  . Cystitis, interstitial   . Endometriosis   . Goiter   . Hyperprolactinemia ([place])   . Migraine aura without headache   . Polycystic ovarian syndrome    Past Surgical History:  Procedure Laterality Date  . APPENDECTOMY    . DIAGNOSTIC LAPAROSCOPY     Social History   Socioeconomic History  . Marital status: Single    Spouse name: None  . Number of children: None  . Years of education: None  . Highest education level: None  Social Needs  . Financial resource strain: None  . Food insecurity - worry: None  . Food insecurity - inability: None  . Transportation needs - medical: None  . Transportation needs - non-medical: None  Occupational History  . None  Tobacco Use  . Smoking status: Never Smoker  . Smokeless tobacco: Never Used  Substance and Sexual Activity  . Alcohol use: Yes    Alcohol/week: 0.0 oz    Comment:  socially 1-2 a month  . Drug use: No  . Sexual activity: Yes    Partners: Male    Birth control/protection: Pill  Other Topics Concern  . None  Social History Narrative  . None   Allergies  Allergen Reactions  . Codeine Other (See Comments)   Family History  Problem Relation Age of Onset  . Lung cancer Maternal Grandfather   . Ovarian cancer Paternal Grandmother        then developed pancreatic  . Hyperlipidemia Mother   . Hypertension Mother   . Diabetes Father      Past medical history, social, surgical and family history all reviewed in electronic medical record.  No pertanent information unless stated regarding to the chief complaint.   Review of Systems:Review of systems updated and as accurate as of 10/31/17  No headache, visual changes, nausea, vomiting, diarrhea, constipation, dizziness, abdominal pain, skin rash, fevers, chills, night sweats, weight loss, swollen lymph nodes, body aches, joint swelling,  chest pain, shortness of breath, mood changes.  Positive muscle aches  Objective  Blood pressure 98/70, pulse 77, height 5' 4.5" (1.638 m), weight 164 lb (74.4 kg), SpO2 99 %. Systems examined below as of 10/31/17   General: No apparent distress alert and oriented x3 mood and affect normal, dressed appropriately.  HEENT: Pupils equal, extraocular movements intact  Respiratory: Patient's speak in  full sentences and does not appear short of breath  Cardiovascular: No lower extremity edema, non tender, no erythema  Skin: Warm dry intact with no signs of infection or rash on extremities or on axial skeleton.  Abdomen: Soft nontender  Neuro: Cranial nerves II through XII are intact, neurovascularly intact in all extremities with 2+ DTRs and 2+ pulses.  Lymph: No lymphadenopathy of posterior or anterior cervical chain or axillae bilaterally.  Gait normal with good balance and coordination.  MSK:  Non tender with full range of motion and good stability and symmetric  strength and tone of shoulders, elbows, wrist, hip, knee and ankles bilaterally.   Back Exam:  Inspection: Mild loss of lordosis Motion: Flexion 45 deg, Extension 25 deg, Side Bending to 35 deg bilaterally,  Rotation to 35 deg bilaterally  SLR laying: Negative  XSLR laying: Negative  Palpable tenderness: Tender to palpation the paraspinal musculature lumbar spine. FABER: Mild positive right. Sensory change: Gross sensation intact to all lumbar and sacral dermatomes.  Reflexes: 2+ at both patellar tendons, 2+ at achilles tendons, Babinski's downgoing.  Strength at foot  Plantar-flexion: 5/5 Dorsi-flexion: 5/5 Eversion: 5/5 Inversion: 5/5  Leg strength  Quad: 5/5 Hamstring: 5/5 Hip flexor: 5/5 Hip abductors: 5/5  Gait unremarkable.  Osteopathic findings  T6 extended rotated and side bent left L1 flexed rotated and side bent right Sacrum right on right   [id number]; 15 additional minutes spent for Therapeutic exercises as stated in above notes.  This included exercises focusing on stretching, strengthening, with significant focus on eccentric aspects.   Long term goals include an improvement in range of motion, strength, endurance as well as avoiding reinjury. Patient's frequency would include in 1-2 times a day, 3-5 times a week for a duration of 6-12 weeks.  Low back exercises that included:  Pelvic tilt/bracing instruction to focus on control of the pelvic girdle and lower abdominal muscles  Glute strengthening exercises, focusing on proper firing of the glutes without engaging the low back muscles Proper stretching techniques for maximum relief for the hamstrings, hip flexors, low back and some rotation where tolerated Proper technique shown and discussed handout in great detail with ATC.  All questions were discussed and answered.       Impression and Recommendations:     This case required medical decision making of moderate complexity.      Note: This dictation was prepared  with Dragon dictation along with smaller phrase technology. Any transcriptional errors that result from this process are unintentional.

## 2017-10-31 ENCOUNTER — Other Ambulatory Visit: Payer: Self-pay

## 2017-10-31 ENCOUNTER — Ambulatory Visit (INDEPENDENT_AMBULATORY_CARE_PROVIDER_SITE_OTHER): Payer: 59 | Admitting: Family Medicine

## 2017-10-31 ENCOUNTER — Encounter: Payer: Self-pay | Admitting: Family Medicine

## 2017-10-31 DIAGNOSIS — M533 Sacrococcygeal disorders, not elsewhere classified: Secondary | ICD-10-CM

## 2017-10-31 DIAGNOSIS — M999 Biomechanical lesion, unspecified: Secondary | ICD-10-CM | POA: Insufficient documentation

## 2017-10-31 MED ORDER — DICLOFENAC SODIUM 2 % TD SOLN: 2.0000 g | Freq: Two times a day (BID) | TRANSDERMAL | 3 refills | Status: DC

## 2017-10-31 MED ORDER — VITAMIN D (ERGOCALCIFEROL) 1.25 MG (50000 UNIT) PO CAPS: 50000.0000 [IU] | ORAL_CAPSULE | ORAL | 0 refills | Status: DC

## 2017-10-31 NOTE — Patient Instructions (Signed)
Good to see you  Ice 20 minutes 2 times daily. Usually after activity and before bed. pennsaid pinkie amount topically 2 times daily as needed.  Exercises 3 times a week.  Once weekly vitamin D  Over the counter get  Turmeric 500mg  daily  Iron 65mg  with 500mg  of vitamin C daily or 3 times a week if constipation.  See e again in 4 weeks.

## 2017-10-31 NOTE — Assessment & Plan Note (Signed)
Sacroiliac dysfunction.  Home exercises given and work with Product/process development scientist.  We discussed which activities to avoid.  Topical anti-inflammatories given.  Once weekly vitamin D for strength and conditioning.  We discussed avoiding certain other activities.  Follow-up with me again in 4 weeks.

## 2017-10-31 NOTE — Assessment & Plan Note (Signed)
Decision today to treat with OMT was based on Physical Exam  After verbal consent patient was treated with HVLA, ME, FPR techniques in  thoracic, lumbar and sacral areas  Patient tolerated the procedure well with improvement in symptoms  Patient given exercises, stretches and lifestyle modifications  See medications in patient instructions if given  Patient will follow up in 4 weeks 

## 2017-11-26 NOTE — Progress Notes (Signed)
[facility] [address], [place] [address] Phone: [phone] Subjective:      CC: Back pain follow-up  [id number]  Mindy Lowe is a 42 y.o. female coming in with complaint of back pain.  Found to have more of a sacroiliac dysfunction.  Started iron, tumor, vitamin D.  History of endometriosis.  Also started osteopathic manipulation.  Patient states doing much better.  Feels like the vitamin D has made a significant difference.  Such things as even her teeth do not seem to be a soft painful.  Feels like her back is making progress as well.  He is working out on a regular basis now.  Happy with the results so far.   Has had x-rays taken October 27, 2016 that did not show any significant bony abnormality.  Independently visualized by me.  Past Medical History:  Diagnosis Date  . Colitis, ulcerative chronic (HCC)    Dr Terance Hart  . Cystitis, interstitial   . Endometriosis   . Goiter   . Hyperprolactinemia ([place])   . Migraine aura without headache   . Polycystic ovarian syndrome    Past Surgical History:  Procedure Laterality Date  . APPENDECTOMY    . DIAGNOSTIC LAPAROSCOPY     Social History   Socioeconomic History  . Marital status: Single    Spouse name: None  . Number of children: None  . Years of education: None  . Highest education level: None  Social Needs  . Financial resource strain: None  . Food insecurity - worry: None  . Food insecurity - inability: None  . Transportation needs - medical: None  . Transportation needs - non-medical: None  Occupational History  . None  Tobacco Use  . Smoking status: Never Smoker  . Smokeless tobacco: Never Used  Substance and Sexual Activity  . Alcohol use: Yes    Alcohol/week: 0.0 oz    Comment: socially 1-2 a month  . Drug use: No  . Sexual activity: Yes    Partners: Male    Birth control/protection: Pill  Other Topics Concern  . None  Social History Narrative  . None     Allergies  Allergen Reactions  . Codeine Other (See Comments)   Family History  Problem Relation Age of Onset  . Lung cancer Maternal Grandfather   . Ovarian cancer Paternal Grandmother        then developed pancreatic  . Hyperlipidemia Mother   . Hypertension Mother   . Diabetes Father      Past medical history, social, surgical and family history all reviewed in electronic medical record.  No pertanent information unless stated regarding to the chief complaint.   Review of Systems:Review of systems updated and as accurate as of 11/27/17  No headache, visual changes, nausea, vomiting, diarrhea, constipation, dizziness, abdominal pain, skin rash, fevers, chills, night sweats, weight loss, swollen lymph nodes, body aches, joint swelling, muscle aches, chest pain, shortness of breath, mood changes.   Objective  Blood pressure 100/70, pulse 86, height 5\' 4"  (1.626 m), weight 160 lb (72.6 kg), SpO2 98 %. Systems examined below as of 11/27/17   General: No apparent distress alert and oriented x3 mood and affect normal, dressed appropriately.  HEENT: Pupils equal, extraocular movements intact  Respiratory: Patient's speak in full sentences and does not appear short of breath  Cardiovascular: No lower extremity edema, non tender, no erythema  Skin: Warm dry intact with no signs of infection or rash on  extremities or on axial skeleton.  Abdomen: Soft nontender  Neuro: Cranial nerves II through XII are intact, neurovascularly intact in all extremities with 2+ DTRs and 2+ pulses.  Lymph: No lymphadenopathy of posterior or anterior cervical chain or axillae bilaterally.  Gait normal with good balance and coordination.  MSK:  Non tender with full range of motion and good stability and symmetric strength and tone of shoulders, elbows, wrist, hip, knee and ankles bilaterally.  Back Exam:  Inspection: Unremarkable  Motion: Flexion 45 deg, Extension 20 deg, Side Bending to 30 deg bilaterally,   Rotation to 35 deg bilaterally  SLR laying: Negative  XSLR laying: Negative  Palpable tenderness: Tender to palpation in the paraspinal musculature lumbar spine right greater than left. FABER: positive . Sensory change: Gross sensation intact to all lumbar and sacral dermatomes.  Reflexes: 2+ at both patellar tendons, 2+ at achilles tendons, Babinski's downgoing.  Strength at foot  Plantar-flexion: 5/5 Dorsi-flexion: 5/5 Eversion: 5/5 Inversion: 5/5  Leg strength  Quad: 5/5 Hamstring: 5/5 Hip flexor: 5/5 Hip abductors: 4/5 but symmetric Gait unremarkable.  Osteopathic findings  C2 flexed rotated and side bent right C4 flexed rotated and side bent left C6 flexed rotated and side bent left T3 extended rotated and side bent right inhaled third rib T9 extended rotated and side bent left L2 flexed rotated and side bent right Sacrum right on right    Impression and Recommendations:     This case required medical decision making of moderate complexity.      Note: This dictation was prepared with Dragon dictation along with smaller phrase technology. Any transcriptional errors that result from this process are unintentional.

## 2017-11-27 ENCOUNTER — Encounter: Payer: Self-pay | Admitting: Family Medicine

## 2017-11-27 ENCOUNTER — Ambulatory Visit (INDEPENDENT_AMBULATORY_CARE_PROVIDER_SITE_OTHER): Payer: 59 | Admitting: Family Medicine

## 2017-11-27 VITALS — BP 100/70 | HR 86 | Ht 64.0 in | Wt 160.0 lb

## 2017-11-27 DIAGNOSIS — M533 Sacrococcygeal disorders, not elsewhere classified: Secondary | ICD-10-CM | POA: Diagnosis not present

## 2017-11-27 DIAGNOSIS — M999 Biomechanical lesion, unspecified: Secondary | ICD-10-CM

## 2017-11-27 MED ORDER — VITAMIN D (ERGOCALCIFEROL) 1.25 MG (50000 UNIT) PO CAPS: 50000.0000 [IU] | ORAL_CAPSULE | ORAL | 0 refills | Status: DC

## 2017-11-27 NOTE — Patient Instructions (Signed)
Good to see you  Mindy Lowe is your friend.  Stay active.  I am impressed.  Continue the vitaminD See me again in 6-8 weeks.

## 2017-11-27 NOTE — Assessment & Plan Note (Signed)
Improvement at this time.  Doing well with the osteopathic manipulation in the icing regimen.  6-8 weeks

## 2017-11-27 NOTE — Assessment & Plan Note (Signed)
Decision today to treat with OMT was based on Physical Exam  After verbal consent patient was treated with HVLA, ME, FPR techniques in cervical, thoracic, lumbar and sacral areas  Patient tolerated the procedure well with improvement in symptoms  Patient given exercises, stretches and lifestyle modifications  See medications in patient instructions if given  Patient will follow up in 6-8 weeks 

## 2017-12-07 DIAGNOSIS — H40003 Preglaucoma, unspecified, bilateral: Secondary | ICD-10-CM | POA: Diagnosis not present

## 2018-01-15 DIAGNOSIS — J301 Allergic rhinitis due to pollen: Secondary | ICD-10-CM | POA: Diagnosis not present

## 2018-01-23 ENCOUNTER — Other Ambulatory Visit: Payer: Self-pay | Admitting: Family Medicine

## 2018-01-23 NOTE — Telephone Encounter (Signed)
Refill done.  

## 2018-03-22 DIAGNOSIS — Z0000 Encounter for general adult medical examination without abnormal findings: Secondary | ICD-10-CM | POA: Diagnosis not present

## 2018-03-26 DIAGNOSIS — Z0000 Encounter for general adult medical examination without abnormal findings: Secondary | ICD-10-CM | POA: Diagnosis not present

## 2018-03-26 DIAGNOSIS — Z1231 Encounter for screening mammogram for malignant neoplasm of breast: Secondary | ICD-10-CM | POA: Diagnosis not present

## 2018-04-13 DIAGNOSIS — L700 Acne vulgaris: Secondary | ICD-10-CM | POA: Diagnosis not present

## 2018-05-01 ENCOUNTER — Other Ambulatory Visit: Payer: Self-pay | Admitting: *Deleted

## 2018-05-01 MED ORDER — VITAMIN D (ERGOCALCIFEROL) 1.25 MG (50000 UNIT) PO CAPS: ORAL_CAPSULE | ORAL | 0 refills | Status: DC

## 2018-06-06 DIAGNOSIS — M9905 Segmental and somatic dysfunction of pelvic region: Secondary | ICD-10-CM | POA: Diagnosis not present

## 2018-06-06 DIAGNOSIS — M9903 Segmental and somatic dysfunction of lumbar region: Secondary | ICD-10-CM | POA: Diagnosis not present

## 2018-06-06 DIAGNOSIS — M5137 Other intervertebral disc degeneration, lumbosacral region: Secondary | ICD-10-CM | POA: Diagnosis not present

## 2018-06-12 DIAGNOSIS — M9903 Segmental and somatic dysfunction of lumbar region: Secondary | ICD-10-CM | POA: Diagnosis not present

## 2018-06-12 DIAGNOSIS — M9905 Segmental and somatic dysfunction of pelvic region: Secondary | ICD-10-CM | POA: Diagnosis not present

## 2018-06-12 DIAGNOSIS — M5137 Other intervertebral disc degeneration, lumbosacral region: Secondary | ICD-10-CM | POA: Diagnosis not present

## 2018-06-12 DIAGNOSIS — H40003 Preglaucoma, unspecified, bilateral: Secondary | ICD-10-CM | POA: Diagnosis not present

## 2018-06-18 DIAGNOSIS — M9903 Segmental and somatic dysfunction of lumbar region: Secondary | ICD-10-CM | POA: Diagnosis not present

## 2018-06-18 DIAGNOSIS — M9905 Segmental and somatic dysfunction of pelvic region: Secondary | ICD-10-CM | POA: Diagnosis not present

## 2018-06-18 DIAGNOSIS — M5137 Other intervertebral disc degeneration, lumbosacral region: Secondary | ICD-10-CM | POA: Diagnosis not present

## 2018-06-21 DIAGNOSIS — M9903 Segmental and somatic dysfunction of lumbar region: Secondary | ICD-10-CM | POA: Diagnosis not present

## 2018-06-21 DIAGNOSIS — M5137 Other intervertebral disc degeneration, lumbosacral region: Secondary | ICD-10-CM | POA: Diagnosis not present

## 2018-06-21 DIAGNOSIS — M9905 Segmental and somatic dysfunction of pelvic region: Secondary | ICD-10-CM | POA: Diagnosis not present

## 2018-06-26 DIAGNOSIS — M9903 Segmental and somatic dysfunction of lumbar region: Secondary | ICD-10-CM | POA: Diagnosis not present

## 2018-06-26 DIAGNOSIS — M5137 Other intervertebral disc degeneration, lumbosacral region: Secondary | ICD-10-CM | POA: Diagnosis not present

## 2018-06-26 DIAGNOSIS — M9905 Segmental and somatic dysfunction of pelvic region: Secondary | ICD-10-CM | POA: Diagnosis not present

## 2018-07-24 DIAGNOSIS — M9903 Segmental and somatic dysfunction of lumbar region: Secondary | ICD-10-CM | POA: Diagnosis not present

## 2018-07-24 DIAGNOSIS — M5137 Other intervertebral disc degeneration, lumbosacral region: Secondary | ICD-10-CM | POA: Diagnosis not present

## 2018-07-24 DIAGNOSIS — M9905 Segmental and somatic dysfunction of pelvic region: Secondary | ICD-10-CM | POA: Diagnosis not present

## 2018-08-21 DIAGNOSIS — M9903 Segmental and somatic dysfunction of lumbar region: Secondary | ICD-10-CM | POA: Diagnosis not present

## 2018-08-21 DIAGNOSIS — M5137 Other intervertebral disc degeneration, lumbosacral region: Secondary | ICD-10-CM | POA: Diagnosis not present

## 2018-08-21 DIAGNOSIS — M9905 Segmental and somatic dysfunction of pelvic region: Secondary | ICD-10-CM | POA: Diagnosis not present

## 2018-09-05 DIAGNOSIS — J069 Acute upper respiratory infection, unspecified: Secondary | ICD-10-CM | POA: Diagnosis not present

## 2018-10-25 DIAGNOSIS — B9789 Other viral agents as the cause of diseases classified elsewhere: Secondary | ICD-10-CM | POA: Diagnosis not present

## 2018-10-25 DIAGNOSIS — J069 Acute upper respiratory infection, unspecified: Secondary | ICD-10-CM | POA: Diagnosis not present

## 2018-12-04 DIAGNOSIS — J101 Influenza due to other identified influenza virus with other respiratory manifestations: Secondary | ICD-10-CM | POA: Diagnosis not present

## 2018-12-18 ENCOUNTER — Ambulatory Visit (INDEPENDENT_AMBULATORY_CARE_PROVIDER_SITE_OTHER): Payer: 59 | Admitting: Family Medicine

## 2018-12-18 ENCOUNTER — Encounter: Payer: Self-pay | Admitting: Family Medicine

## 2018-12-18 VITALS — BP 120/82 | HR 87 | Temp 98.8°F | Resp 12 | Ht 64.0 in | Wt 156.0 lb

## 2018-12-18 DIAGNOSIS — E559 Vitamin D deficiency, unspecified: Secondary | ICD-10-CM | POA: Diagnosis not present

## 2018-12-18 DIAGNOSIS — R5383 Other fatigue: Secondary | ICD-10-CM | POA: Diagnosis not present

## 2018-12-18 LAB — COMPREHENSIVE METABOLIC PANEL
ALT: 28 U/L (ref 0–35)
AST: 19 U/L (ref 0–37)
Albumin: 4.3 g/dL (ref 3.5–5.2)
Alkaline Phosphatase: 60 U/L (ref 39–117)
BILIRUBIN TOTAL: 0.4 mg/dL (ref 0.2–1.2)
BUN: 17 mg/dL (ref 6–23)
CALCIUM: 9.5 mg/dL (ref 8.4–10.5)
CHLORIDE: 101 meq/L (ref 96–112)
CO2: 28 meq/L (ref 19–32)
CREATININE: 0.8 mg/dL (ref 0.40–1.20)
GFR: 78.17 mL/min (ref >60.00)
GLUCOSE: 87 mg/dL (ref 70–99)
Potassium: 4.4 mEq/L (ref 3.5–5.1)
Sodium: 135 mEq/L (ref 135–145)
Total Protein: 7.3 g/dL (ref 6.0–8.3)

## 2018-12-18 LAB — TSH: TSH: 1.28 u[IU]/mL (ref 0.35–4.50)

## 2018-12-18 LAB — VITAMIN D 25 HYDROXY (VIT D DEFICIENCY, FRACTURES): VITD: 66.96 ng/mL (ref 30.00–100.00)

## 2018-12-18 NOTE — Patient Instructions (Addendum)
A few things to remember from today's visit:   Other fatigue - Plan: Comprehensive metabolic panel, TSH  Vitamin D deficiency, unspecified - Plan: VITAMIN D 25 Hydroxy (Vit-D Deficiency, Fractures)   Please be sure medication list is accurate. If a new problem present, please set up appointment sooner than planned today.

## 2018-12-18 NOTE — Progress Notes (Signed)
HPI:   [name] is a 43 y.o. female, who is here today to establish care.  Former PCP: Ms Mindy Lowe, [place] Last preventive routine visit: 03/26/2018.  Chronic medical problems: Hyperlipidemia, goiter, allergic rhinitis, arthralgia among some. She follows with gynecologist periodically, history of endometriosis. Follows with Dr. Tamala Julian, sport medicine, for arthritis and back pain. In the past he has followed with Dr. Loanne Drilling for hyperprolactinemia.  She denies breast tenderness or nipple discharge.   Recently diagnosed with influenza A, 12/04/2018. She is no longer having fever, chills, body aches. Still having nonproductive cough and dysphonia. Denies wheezing, dyspnea, or chest pain.  Concerns today: Fatigue. She denies history of chronic fatigue. Problem is started a few months ago. She denies depression or anxiety symptoms. She sleeps about 6 hours, does not feel rested when she first gets up in the morning. Negative for weakness apnea, she has been told she has loud snoring.  Vitamin D deficiency, currently she is on a ergocalciferol 50,000 units weekly. She is not sure about when 25 OH vitamin D was checked.  She is concerned about symptoms being related to low calcium.  She is not aware of history of hyperparathyroidism. She denies muscle fasciculation, changes in bowel habits, nausea, vomiting, or MS changes. She takes Ca++ supplementation in her multivitamin and through her diet.  Lab Results  Component Value Date   TSH 1.26 08/03/2015   Past history of anemia, attributed to heavy menses.  Currently she is on OCP.  She exercises regularly, at least 5 times per week. She also follows a healthful diet. She lives with her mother.   Review of Systems  Constitutional: Positive for fatigue. Negative for activity change, appetite change and fever.  HENT: Positive for voice change. Negative for mouth sores, nosebleeds and trouble swallowing.   Eyes: Negative  for redness and visual disturbance.  Respiratory: Positive for cough. Negative for shortness of breath and wheezing.   Cardiovascular: Negative for chest pain, palpitations and leg swelling.  Gastrointestinal: Negative for abdominal pain, nausea and vomiting.       Negative for changes in bowel habits.  Endocrine: Negative for cold intolerance and heat intolerance.  Genitourinary: Negative for decreased urine volume, dysuria and hematuria.  Musculoskeletal: Negative for gait problem and myalgias.  Skin: Negative for pallor and rash.  Allergic/Immunologic: Positive for environmental allergies.  Neurological: Negative for syncope, weakness and headaches.  Hematological: Negative for adenopathy. Does not bruise/bleed easily.  Psychiatric/Behavioral: Positive for sleep disturbance. The patient is not nervous/anxious.     Current Outpatient Medications on File Prior to Visit  Medication Sig Dispense Refill  . aspirin 325 MG tablet Take 325 mg by mouth as needed for pain.    Marland Kitchen CALCIUM PO Take 400 mg by mouth. 2 capsules daily    . CRANBERRY PO Take by mouth. 2 a day    . Diclofenac Sodium (PENNSAID) 2 % SOLN Place 2 g onto the skin 2 (two) times daily. 112 g 3  . Evening Primrose Oil 1000 MG CAPS Take by mouth daily.    Marland Kitchen ibuprofen (ADVIL,MOTRIN) 200 MG tablet Take 200 mg by mouth every 6 (six) hours as needed.    . Lactobacillus (ACIDOPHILUS PO) Take by mouth. 2 a day    . Multiple Vitamin (MULTIVITAMIN) tablet Take 1 tablet by mouth daily.    . NON FORMULARY     . spironolactone (ALDACTONE) 50 MG tablet Take by mouth.    . Vitamin D, Ergocalciferol, (  DRISDOL) 50000 units CAPS capsule Take 1 capsule (50,000 Units total) by mouth every 7 (seven) days. 12 capsule 0   No current facility-administered medications on file prior to visit.      Past Medical History:  Diagnosis Date  . Colitis, ulcerative chronic (HCC)    Dr Terance Hart  . Cystitis, interstitial   . Endometriosis   . Goiter    . Hyperprolactinemia ([place])   . Migraine aura without headache   . Polycystic ovarian syndrome    Allergies  Allergen Reactions  . Codeine Other (See Comments)    Other reaction(s): Dizziness (intolerance)    Family History  Problem Relation Age of Onset  . Lung cancer Maternal Grandfather   . Cancer Maternal Grandfather   . Ovarian cancer Paternal Grandmother        then developed pancreatic  . Cancer Paternal Grandmother   . Diabetes Paternal Grandmother   . Hyperlipidemia Mother   . Hypertension Mother   . Arthritis Mother   . Diabetes Father   . Early death Father   . Heart attack Father   . Early death Maternal Grandmother     Social History   Socioeconomic History  . Marital status: Single    Spouse name: Not on file  . Number of children: Not on file  . Years of education: Not on file  . Highest education level: Not on file  Occupational History  . Not on file  Social Needs  . Financial resource strain: Not on file  . Food insecurity:    Worry: Not on file    Inability: Not on file  . Transportation needs:    Medical: Not on file    Non-medical: Not on file  Tobacco Use  . Smoking status: Never Smoker  . Smokeless tobacco: Never Used  Substance and Sexual Activity  . Alcohol use: Yes    Alcohol/week: 0.0 standard drinks    Comment: socially 1-2 a month  . Drug use: No  . Sexual activity: Yes    Partners: Male    Birth control/protection: Pill  Lifestyle  . Physical activity:    Days per week: Not on file    Minutes per session: Not on file  . Stress: Not on file  Relationships  . Social connections:    Talks on phone: Not on file    Gets together: Not on file    Attends religious service: Not on file    Active member of club or organization: Not on file    Attends meetings of clubs or organizations: Not on file    Relationship status: Not on file  Other Topics Concern  . Not on file  Social History Narrative  . Not on file    Vitals:    12/18/18 1455  BP: 120/82  Pulse: 87  Resp: 12  Temp: 98.8 F (37.1 C)  SpO2: 97%    Body mass index is 26.78 kg/m.   Physical Exam  Nursing note and vitals reviewed. Constitutional: She is oriented to person, place, and time. She appears well-developed and well-nourished. No distress.  HENT:  Head: Normocephalic and atraumatic.  Mouth/Throat: Oropharynx is clear and moist and mucous membranes are normal.  Mild dysphonia.  Eyes: Pupils are equal, round, and reactive to light. Conjunctivae are normal.  Neck: No tracheal deviation present. Thyromegaly present.  Cardiovascular: Normal rate and regular rhythm.  No murmur heard. Pulses:      Dorsalis pedis pulses are 2+ on the right side  and 2+ on the left side.  Respiratory: Effort normal and breath sounds normal. No respiratory distress.  GI: Soft. She exhibits no mass. There is no hepatomegaly. There is no abdominal tenderness.  Musculoskeletal:        General: No edema.  Lymphadenopathy:    She has no cervical adenopathy.  Neurological: She is alert and oriented to person, place, and time. She has normal strength. No cranial nerve deficit. Gait normal.  Skin: Skin is warm. No rash noted. No erythema.  Psychiatric: She has a normal mood and affect.  Well groomed, good eye contact.    ASSESSMENT AND PLAN:  Ms. Mindy Lowe was seen today for establish care.  Orders Placed This Encounter  Procedures  . VITAMIN D 25 Hydroxy (Vit-D Deficiency, Fractures)  . Comprehensive metabolic panel  . TSH    Lab Results  Component Value Date   TSH 1.28 12/18/2018   Lab Results  Component Value Date   ALT 28 12/18/2018   AST 19 12/18/2018   ALKPHOS 60 12/18/2018   BILITOT 0.4 12/18/2018   Lab Results  Component Value Date   CREATININE 0.80 12/18/2018   BUN 17 12/18/2018   NA 135 12/18/2018   K 4.4 12/18/2018   CL 101 12/18/2018   CO2 28 12/18/2018    Vitamin D deficiency, unspecified No changes in current  management, will follow labs done today and will give further recommendations accordingly.   Other fatigue We discussed possible etiologies: Systemic illness, immunologic,endocrinology,sleep disorder, psychiatric/psychologic, infectious,medications side effects, and idiopathic. Examination today does not suggest a serious process. Healthy diet and regular physical activity may help.  Further recommendations will be given according to lab results.  -     Comprehensive metabolic panel -     TSH   Return in about 6 months (around 06/20/2019) for cpe.      Seraj Dunnam G. [place], MD  [facility]. [place] office.

## 2018-12-18 NOTE — Assessment & Plan Note (Signed)
No changes in current management, will follow labs done today and will give further recommendations accordingly.  

## 2018-12-20 ENCOUNTER — Telehealth: Payer: Self-pay | Admitting: Family Medicine

## 2018-12-20 NOTE — Telephone Encounter (Signed)
Copied from [place] [id number]. Topic: Quick Communication - Lab Results (Clinic Use ONLY) >> Dec 19, 2018  4:37 PM Zacarias Pontes, CMA wrote: Called patient to inform them of their lab results. When patient returns call, triage nurse may disclose results.   Pt calling back for results. Please advise

## 2018-12-20 NOTE — Telephone Encounter (Signed)
Returned call to pt .  See result note.  

## 2019-06-24 ENCOUNTER — Ambulatory Visit (INDEPENDENT_AMBULATORY_CARE_PROVIDER_SITE_OTHER): Payer: 59 | Admitting: Family Medicine

## 2019-06-24 ENCOUNTER — Other Ambulatory Visit: Payer: Self-pay

## 2019-06-24 ENCOUNTER — Encounter: Payer: Self-pay | Admitting: Family Medicine

## 2019-06-24 VITALS — BP 112/70 | HR 84 | Temp 97.9°F | Resp 12 | Ht 64.0 in | Wt 162.2 lb

## 2019-06-24 DIAGNOSIS — R300 Dysuria: Secondary | ICD-10-CM | POA: Diagnosis not present

## 2019-06-24 DIAGNOSIS — E559 Vitamin D deficiency, unspecified: Secondary | ICD-10-CM | POA: Diagnosis not present

## 2019-06-24 DIAGNOSIS — Z0000 Encounter for general adult medical examination without abnormal findings: Secondary | ICD-10-CM | POA: Diagnosis not present

## 2019-06-24 DIAGNOSIS — E785 Hyperlipidemia, unspecified: Secondary | ICD-10-CM | POA: Diagnosis not present

## 2019-06-24 DIAGNOSIS — Z13228 Encounter for screening for other metabolic disorders: Secondary | ICD-10-CM

## 2019-06-24 DIAGNOSIS — Z130 Encounter for screening for diseases of the blood and blood-forming organs and certain disorders involving the immune mechanism: Secondary | ICD-10-CM | POA: Diagnosis not present

## 2019-06-24 DIAGNOSIS — Z1329 Encounter for screening for other suspected endocrine disorder: Secondary | ICD-10-CM | POA: Diagnosis not present

## 2019-06-24 LAB — URINALYSIS, ROUTINE W REFLEX MICROSCOPIC
Bilirubin Urine: NEGATIVE
Ketones, ur: NEGATIVE
Leukocytes,Ua: NEGATIVE
Nitrite: NEGATIVE
Specific Gravity, Urine: 1.02 (ref 1.000–1.030)
Total Protein, Urine: NEGATIVE
Urine Glucose: NEGATIVE
Urobilinogen, UA: 0.2 (ref 0.0–1.0)
pH: 7.5 (ref 5.0–8.0)

## 2019-06-24 LAB — LIPID PANEL
Cholesterol: 218 mg/dL — ABNORMAL HIGH (ref 0–200)
HDL: 49.7 mg/dL (ref >39.00)
LDL Cholesterol: 135 mg/dL — ABNORMAL HIGH (ref 0–99)
NonHDL: 168.69
Total CHOL/HDL Ratio: 4
Triglycerides: 169 mg/dL — ABNORMAL HIGH (ref 0.0–149.0)
VLDL: 33.8 mg/dL (ref 0.0–40.0)

## 2019-06-24 LAB — VITAMIN D 25 HYDROXY (VIT D DEFICIENCY, FRACTURES): VITD: 52.27 ng/mL (ref 30.00–100.00)

## 2019-06-24 LAB — HEMOGLOBIN A1C: Hgb A1c MFr Bld: 5.6 % (ref 4.6–6.5)

## 2019-06-24 NOTE — Progress Notes (Signed)
HPI:   Mindy Lowe is a 43 y.o. female, who is here today for her routine physical.  Last CPE: 03/26/2018, gyn preventive visit.  Regular exercise 3 or more time per week: She is no longer going to the gym due to COVID-19 pandemia started.  At home she is doing Pilates, yoga, and walking around her neighborhood about 5 times per week. Following a healthful diet: She does consistently. She lives with her mother.  Chronic medical problems: HLD,goiter,back pain,and migraine among some.  Pap smear: 2018.  She follows with gynecologist regularly. Hx of abnormal pap smears: Denies.  Hx of STD's:Negative.   Immunization History  Administered Date(s) Administered  . Td 11/16/2005  . Tdap 10/03/2009    Mammogram: 2 years ago. Hx of fibrocystic breast disease.  She has has concerns today. 2 weeks of dysuria. Symptom has improved. Initially she also had urinary frequency, has resolved. She has been taking OTC cranberry tablets, which has helped.  No changes in chronic low back pain. She is not sexually active.  She denies fever, chills, abdominal pain, nausea, vomiting, urine urgency, gross hematuria, urine incontinence, vaginal discharge, or abnormal vaginal bleeding.  Vitamin D deficiency, currently she is on ergocalciferol 50,000 units every 10 days, frequency was decreased in 12/2018. Last visit, 12/18/2018, she was complaining about fatigue.  Today she is reporting that problem has resolved.  HLD: 10 years ago TC 208,TG 161,HDL 42, and LDL 135. She is on non pharmacologic treatment.   Review of Systems  Constitutional: Negative for appetite change, fatigue and fever.  HENT: Negative for dental problem, hearing loss, mouth sores, sore throat, trouble swallowing and voice change.   Eyes: Negative for redness and visual disturbance.  Respiratory: Negative for cough, shortness of breath and wheezing.   Cardiovascular: Negative for chest pain and leg swelling.   Gastrointestinal: Negative for abdominal pain, nausea and vomiting.       No changes in bowel habits.  Endocrine: Negative for cold intolerance, heat intolerance, polydipsia, polyphagia and polyuria.  Genitourinary: Negative for decreased urine volume. Musculoskeletal: Positive for arthralgias, gait problem and myalgias.  Skin: Negative for color change and rash.  Allergic/Immunologic: Negative for environmental allergies.  Neurological: Negative for syncope, weakness and headaches.  Hematological: Negative for adenopathy. Does not bruise/bleed easily.  Psychiatric/Behavioral: Negative for confusion and sleep disturbance. The patient is not nervous/anxious.   All other systems reviewed and are negative.   Current Outpatient Medications on File Prior to Visit  Medication Sig Dispense Refill  . aspirin 325 MG tablet Take 325 mg by mouth as needed for pain.    Marland Kitchen CALCIUM PO Take 400 mg by mouth. 2 capsules daily    . CRANBERRY PO Take by mouth. 2 a day    . Diclofenac Sodium (PENNSAID) 2 % SOLN Place 2 g onto the skin 2 (two) times daily. 112 g 3  . Evening Primrose Oil 1000 MG CAPS Take by mouth daily.    Marland Kitchen ibuprofen (ADVIL,MOTRIN) 200 MG tablet Take 200 mg by mouth every 6 (six) hours as needed.    . Lactobacillus (ACIDOPHILUS PO) Take by mouth. 2 a day    . Multiple Vitamin (MULTIVITAMIN) tablet Take 1 tablet by mouth daily.    . NON FORMULARY     . Vitamin D, Ergocalciferol, (DRISDOL) 50000 units CAPS capsule Take 1 capsule (50,000 Units total) by mouth every 7 (seven) days. 12 capsule 0  . spironolactone (ALDACTONE) 50 MG tablet Take by  mouth.     No current facility-administered medications on file prior to visit.    Past Medical History:  Diagnosis Date  . Colitis, ulcerative chronic (HCC)    Dr Terance Hart  . Cystitis, interstitial   . Endometriosis   . Goiter   . Hyperprolactinemia ([place])   . Migraine aura without headache   . Polycystic ovarian syndrome     Past Surgical  History:  Procedure Laterality Date  . APPENDECTOMY    . DIAGNOSTIC LAPAROSCOPY      Allergies  Allergen Reactions  . Codeine Other (See Comments)    Other reaction(s): Dizziness (intolerance)    Family History  Problem Relation Age of Onset  . Lung cancer Maternal Grandfather   . Cancer Maternal Grandfather   . Ovarian cancer Paternal Grandmother        then developed pancreatic  . Cancer Paternal Grandmother   . Diabetes Paternal Grandmother   . Hyperlipidemia Mother   . Hypertension Mother   . Arthritis Mother   . Diabetes Father   . Early death Father   . Heart attack Father   . Early death Maternal Grandmother     Social History   Socioeconomic History  . Marital status: Single    Spouse name: Not on file  . Number of children: Not on file  . Years of education: Not on file  . Highest education level: Not on file  Occupational History  . Not on file  Social Needs  . Financial resource strain: Not on file  . Food insecurity    Worry: Not on file    Inability: Not on file  . Transportation needs    Medical: Not on file    Non-medical: Not on file  Tobacco Use  . Smoking status: Never Smoker  . Smokeless tobacco: Never Used  Substance and Sexual Activity  . Alcohol use: Yes    Alcohol/week: 0.0 standard drinks    Comment: socially 1-2 a month  . Drug use: No  . Sexual activity: Yes    Partners: Male    Birth control/protection: Pill  Lifestyle  . Physical activity    Days per week: Not on file    Minutes per session: Not on file  . Stress: Not on file  Relationships  . Social Herbalist on phone: Not on file    Gets together: Not on file    Attends religious service: Not on file    Active member of club or organization: Not on file    Attends meetings of clubs or organizations: Not on file    Relationship status: Not on file  Other Topics Concern  . Not on file  Social History Narrative  . Not on file    Vitals:   06/24/19 0808   BP: 112/70  Pulse: 84  Resp: 12  Temp: 97.9 F (36.6 C)  SpO2: 99%   Body mass index is 27.85 kg/m.   Wt Readings from Last 3 Encounters:  06/24/19 162 lb 4 oz (73.6 kg)  12/18/18 156 lb (70.8 kg)  11/27/17 160 lb (72.6 kg)   Physical Exam  Nursing note and vitals reviewed. Constitutional: She is oriented to person, place, and time. She appears well-developed and well-nourished. No distress.  HENT:  Head: Normocephalic and atraumatic.  Right Ear: Hearing, tympanic membrane, external ear and ear canal normal.  Left Ear: Hearing, tympanic membrane, external ear and ear canal normal.  Mouth/Throat: Uvula is midline, oropharynx is clear  and moist and mucous membranes are normal.  Eyes: Pupils are equal, round, and reactive to light. Conjunctivae and EOM are normal.  Neck: No tracheal deviation present. I do not appreciate thyromegaly today. Cardiovascular: Normal rate and regular rhythm.  No murmur heard. Pulses:      Dorsalis pedis pulses are 2+ on the right side, and 2+ on the left side.  Respiratory: Effort normal and breath sounds normal. No respiratory distress.  GI: Soft. She exhibits no mass. There is no hepatomegaly. There is no tenderness.  Genitourinary:Comments: Deferred to gyn.  Musculoskeletal: She exhibits no edema.  No major deformity or signs of synovitis appreciated.  Lymphadenopathy:    She has no cervical adenopathy.       Right: No supraclavicular adenopathy present.       Left: No supraclavicular adenopathy present.  Neurological: She is alert and oriented to person, place, and time. She has normal strength. No cranial nerve deficit. Coordination and gait normal.  Reflex Scores:      Bicep reflexes are 2+ on the right side and 2+ on the left side.      Patellar reflexes are 2+ on the right side and 2+ on the left side. Skin: Skin is warm. No rash noted. No erythema.  Psychiatric: She has a normal mood and affect. Cognitive function grossly intact. Well  groomed, good eye contact.     ASSESSMENT AND PLAN:  Mindy Lowe was here today annual physical examination.   Orders Placed This Encounter  Procedures  . Culture, Urine  . VITAMIN D 25 Hydroxy (Vit-D Deficiency, Fractures)  . Hemoglobin A1c  . Lipid panel  . Urinalysis, Routine w reflex microscopic    Lab Results  Component Value Date   CHOL 218 (H) 06/24/2019   HDL 49.70 06/24/2019   LDLCALC 135 (H) 06/24/2019   LDLDIRECT 137.3 02/17/2009   TRIG 169.0 (H) 06/24/2019   CHOLHDL 4 06/24/2019   Lab Results  Component Value Date   HGBA1C 5.6 06/24/2019     Routine general medical examination at a health care facility We discussed the importance of regular physical activity and healthy diet for prevention of chronic illness and/or complications. Preventive guidelines reviewed. Vaccination up to date, Tdap due next year. Refused influenza vaccine. She will continue following with gynecologist for her female preventive care.  Next CPE in a year. The 10-year ASCVD risk score Mikey Bussing DC Brooke Bonito., et al., 2013) is: 0.8%   Values used to calculate the score:     Age: 62 years     Sex: Female     Is Non-Hispanic African American: No     Diabetic: No     Tobacco smoker: No     Systolic Blood Pressure: [id number] mmHg     Is BP treated: No     HDL Cholesterol: 49.7 mg/dL     Total Cholesterol: 218 mg/dL  Vitamin D deficiency, unspecified No changes in current management, will follow 25 OH vit D done today and will give further recommendations accordingly.  -     VITAMIN D 25 Hydroxy (Vit-D Deficiency, Fractures)  Hyperlipidemia, unspecified hyperlipidemia type Continue low fat diet. Further recommendations will be given according to lab results.  -     Lipid panel  Screening for endocrine, metabolic and immunity disorder -     Hemoglobin A1c  Dysuria Problem has improved. Hx of IC. For now I am recommending empiric antibiotic treatment. We will wait for UA and  urine culture and make  recommendations accordingly. Avoid foods/beverages that can cause bladder irritation. Instructed about warning signs.  -     Urinalysis, Routine w reflex microscopic -     Culture, Urine    Return in 1 year (on 06/23/2020) for cpe.    Cristofher Livecchi G. [place], MD  [facility]. [place] office.

## 2019-06-24 NOTE — Patient Instructions (Signed)
Today you have you routine preventive visit.  At least 150 minutes of moderate exercise per week, daily brisk walking for 15-30 min is a good exercise option. Healthy diet low in saturated (animal) fats and sweets and consisting of fresh fruits and vegetables, lean meats such as fish and white chicken and whole grains.  These are some of recommendations for screening depending of age and risk factors:   - Vaccines:  Tdap vaccine every 10 years. Le toca en 2021.  Shingles vaccine recommended at age 17, could be given after 44 years of age but not sure about insurance coverage.   Pneumonia vaccines:  Prevnar 13 at 65 and Pneumovax at 61. Sometimes Pneumovax is giving earlier if history of smoking, lung disease,diabetes,kidney disease among some.    Screening for diabetes at age 63 and every 3 years.  Cervical cancer prevention:  Pap smear starts at 44 years of age and continues periodically until 44 years old in low risk women. Pap smear every 3 years between 60 and 44 years old. Pap smear every 3-5 years between women 26 and older if pap smear negative and HPV screening negative.   -Breast cancer: Mammogram: There is disagreement between experts about when to start screening in low risk asymptomatic female but recent recommendations are to start screening at 46 and not later than 44 years old , every 1-2 years and after 44 yo q 2 years. Screening is recommended until 44 years old but some women can continue screening depending of healthy issues.   Colon cancer screening: starts at 44 years old until 44 years old.  Cholesterol disorder screening at age 26 and every 3 years.  Also recommended:  1. Dental visit- Brush and floss your teeth twice daily; visit your dentist twice a year. 2. Eye doctor- Get an eye exam at least every 2 years. 3. Helmet use- Always wear a helmet when riding a bicycle, motorcycle, rollerblading or skateboarding. 4. Safe sex- If you may be exposed to  sexually transmitted infections, use a condom. 5. Seat belts- Seat belts can save your live; always wear one. 6. Smoke/Carbon Monoxide detectors- These detectors need to be installed on the appropriate level of your home. Replace batteries at least once a year. 7. Skin cancer- When out in the sun please cover up and use sunscreen 15 SPF or higher. 8. Violence- If anyone is threatening or hurting you, please tell your healthcare provider.  9. Drink alcohol in moderation- Limit alcohol intake to one drink or less per day. Never drink and drive.

## 2019-06-26 LAB — URINE CULTURE
MICRO NUMBER:: 903801
Result:: NO GROWTH
SPECIMEN QUALITY:: ADEQUATE

## 2019-09-09 ENCOUNTER — Other Ambulatory Visit: Payer: Self-pay | Admitting: Family Medicine

## 2019-09-09 MED ORDER — VITAMIN D (ERGOCALCIFEROL) 1.25 MG (50000 UNIT) PO CAPS: 50000.0000 [IU] | ORAL_CAPSULE | ORAL | 0 refills | Status: DC

## 2019-09-09 NOTE — Telephone Encounter (Signed)
Requested medication (s) are due for refill today: yes  Requested medication (s) are on the active medication list: yes  Last refill: 11/27/2017  Future visit scheduled: no  Notes to clinic: refill cannot be delegated    Requested Prescriptions  Pending Prescriptions Disp Refills   Vitamin D, Ergocalciferol, (DRISDOL) 1.25 MG (50000 UT) CAPS capsule 12 capsule 0    Sig: Take 1 capsule (50,000 Units total) by mouth every 7 (seven) days.     Endocrinology:  Vitamins - Vitamin D Supplementation Failed - 09/09/2019 10:46 AM      Failed - 50,000 IU strengths are not delegated      Failed - Phosphate in normal range and within 360 days    No results found for: PHOS       Failed - Vitamin D in normal range and within 360 days    VITD  Date Value Ref Range Status  06/24/2019 52.27 30.00 - 100.00 ng/mL Final         Passed - Ca in normal range and within 360 days    Calcium  Date Value Ref Range Status  12/18/2018 9.5 8.4 - 10.5 mg/dL Final         Passed - Valid encounter within last 12 months    Recent Outpatient Visits          2 months ago Routine general medical examination at a health care facility   [facility] [place], Malka So, MD   8 months ago Other fatigue   [facility] [place], Malka So, MD   1 year ago Nonallopathic lesion of thoracic region   [place], [place], DO   1 year ago SI (sacroiliac) joint dysfunction   [place], Olevia [place], DO

## 2019-09-09 NOTE — Telephone Encounter (Signed)
Copied from [place] [id number]. Topic: Quick Communication - Rx Refill/Question >> Sep 09, 2019  9:58 AM Rainey Pines A wrote: Medication: Vitamin D, Ergocalciferol, (DRISDOL) 50000 units CAPS capsule   Has the patient contacted their pharmacy? Yes (Agent: If no, request that the patient contact the pharmacy for the refill.) (Agent: If yes, when and what did the pharmacy advise?)Contact PCP  Preferred Pharmacy (with phone number or street name): [id number] - [place], [place] [phone] (Phone) [phone] (Fax)    Agent: Please be advised that RX refills may take up to 3 business days. We ask that you follow-up with your pharmacy.

## 2020-04-07 ENCOUNTER — Other Ambulatory Visit: Payer: Self-pay | Admitting: Family Medicine

## 2020-08-10 NOTE — Progress Notes (Signed)
HPI: Mindy Lowe is a 44 y.o. female, who is here today for her routine physical.  Last CPE: 06/24/19.  Regular exercise 3 or more time per week: She was taking care of her mother, who recently had a stroke, gained some wt.. She just started a few months ago,polaris,walking,gym machines 4 times per week. Following a healthy diet: She tries to do so. She cooks at home, eats 2-3 times per day.  She lives with her mother.  Chronic medical problems: HLD,chronic back pain,migraine, vit D deficiency,and goiter.  Pap smear:3+ years.05/04/2015. Hx of endometriosis, "little" abnormal pap smear but stable, recommended 3 year. Negative for Intraepithelial Lesions or Malignancy.    HPV not detected.  ADEQUACY OF SPECIMEN:       Satisfactory for evaluation. Endocervical cells/transformation zone component identified.   She is established with gyn.  Immunization History  Administered Date(s) Administered  . Influenza,inj,Quad PF,6+ Mos 08/11/2020  . Td 11/16/2005  . Tdap 10/03/2009, 08/11/2020   Mammogram: 03/2013. Colonoscopy: N/A DEXA: N/A  Hep C screening: 03/26/18 NR  She has no new  concerns today.  Lab Results  Component Value Date   CHOL 218 (H) 06/24/2019   HDL 49.70 06/24/2019   LDLCALC 135 (H) 06/24/2019   LDLDIRECT 137.3 02/17/2009   TRIG 169.0 (H) 06/24/2019   CHOLHDL 4 06/24/2019   About 15 years ago she was evaluated by endocrinologist because acne,hair loss,and wt gain. Prolactin was elevated. She was treated with Bromocriptine (?). She doe snot remember having test done after treatment. She is not having breast tenderness or nipple discharge. Menses are regular.  LMP: mid 07/2020.  UC: She is not having diarrhea or abdominal pain.She has not followed with GI in a while.  Vit D deficiency: She is on Ergocalciferol 50,000 U q 2 weeks.  Goiter: Last TSH on 12/18/18 normal at 1.2. She has not noted abnormal wt loss,palpitations,or  tremor.  She mentions having intermittent SOB, she attributes it to mask use. She doe snot feels SOB when she is not wearing the mask. No associated cough,CP,or diaphoresis.  She is also concerned about possible hearing loss.Worse when there is background noise. Negative for earache or drainage.  Review of Systems  Constitutional: Negative for appetite change, fatigue and fever.  HENT: Negative for facial swelling, mouth sores, sore throat and trouble swallowing.   Eyes: Negative for redness and visual disturbance.  Respiratory: Negative for chest tightness and wheezing.   Cardiovascular: Negative for leg swelling.  Gastrointestinal: Negative for abdominal pain, nausea and vomiting.       No changes in bowel habits.  Endocrine: Negative for cold intolerance, heat intolerance, polydipsia, polyphagia and polyuria.  Genitourinary: Negative for decreased urine volume, dysuria, hematuria, vaginal bleeding and vaginal discharge.  Musculoskeletal: Negative for gait problem and myalgias.  Skin: Negative for color change and rash.  Allergic/Immunologic: Positive for environmental allergies.  Neurological: Negative for syncope, weakness and headaches.  Hematological: Negative for adenopathy. Does not bruise/bleed easily.  Psychiatric/Behavioral: Negative for confusion and sleep disturbance. The patient is not nervous/anxious.   All other systems reviewed and are negative.  Current Outpatient Medications on File Prior to Visit  Medication Sig Dispense Refill  . aspirin 325 MG tablet Take 325 mg by mouth as needed for pain.    Marland Kitchen CALCIUM PO Take 400 mg by mouth. 2 capsules daily    . CRANBERRY PO Take by mouth. 2 a day    . Diclofenac Sodium (PENNSAID) 2 % SOLN Place 2  g onto the skin 2 (two) times daily. 112 g 3  . Evening Primrose Oil 1000 MG CAPS Take by mouth daily.    Marland Kitchen ibuprofen (ADVIL,MOTRIN) 200 MG tablet Take 200 mg by mouth every 6 (six) hours as needed.    . Lactobacillus (ACIDOPHILUS  PO) Take by mouth. 2 a day    . Multiple Vitamin (MULTIVITAMIN) tablet Take 1 tablet by mouth daily.    . NON FORMULARY     . spironolactone (ALDACTONE) 50 MG tablet Take by mouth.    . Vitamin D, Ergocalciferol, (DRISDOL) 1.25 MG (50000 UNIT) CAPS capsule TAKE 1 CAPSULE BY MOUTH EVERY 7 DAYS 12 capsule 0   No current facility-administered medications on file prior to visit.   Past Medical History:  Diagnosis Date  . Colitis, ulcerative chronic (HCC)    Dr Terance Hart  . Cystitis, interstitial   . Endometriosis   . Goiter   . Hyperprolactinemia ([place])   . Migraine aura without headache   . Polycystic ovarian syndrome     Past Surgical History:  Procedure Laterality Date  . APPENDECTOMY    . DIAGNOSTIC LAPAROSCOPY      Allergies  Allergen Reactions  . Codeine Other (See Comments)    Other reaction(s): Dizziness (intolerance)    Family History  Problem Relation Age of Onset  . Lung cancer Maternal Grandfather   . Cancer Maternal Grandfather   . Ovarian cancer Paternal Grandmother        then developed pancreatic  . Cancer Paternal Grandmother   . Diabetes Paternal Grandmother   . Hyperlipidemia Mother   . Hypertension Mother   . Arthritis Mother   . Diabetes Father   . Early death Father   . Heart attack Father   . Early death Maternal Grandmother     Social History   Socioeconomic History  . Marital status: Single    Spouse name: Not on file  . Number of children: Not on file  . Years of education: Not on file  . Highest education level: Not on file  Occupational History  . Not on file  Tobacco Use  . Smoking status: Never Smoker  . Smokeless tobacco: Never Used  Vaping Use  . Vaping Use: Never used  Substance and Sexual Activity  . Alcohol use: Yes    Alcohol/week: 0.0 standard drinks    Comment: socially 1-2 a month  . Drug use: No  . Sexual activity: Yes    Partners: Male    Birth control/protection: Pill  Other Topics Concern  . Not on file   Social History Narrative  . Not on file   Social Determinants of Health   Financial Resource Strain:   . Difficulty of Paying Living Expenses: Not on file  Food Insecurity:   . Worried About Charity fundraiser in the Last Year: Not on file  . Ran Out of Food in the Last Year: Not on file  Transportation Needs:   . Lack of Transportation (Medical): Not on file  . Lack of Transportation (Non-Medical): Not on file  Physical Activity:   . Days of Exercise per Week: Not on file  . Minutes of Exercise per Session: Not on file  Stress:   . Feeling of Stress : Not on file  Social Connections:   . Frequency of Communication with Friends and Family: Not on file  . Frequency of Social Gatherings with Friends and Family: Not on file  . Attends Religious Services: Not on file  .  Active Member of Clubs or Organizations: Not on file  . Attends Archivist Meetings: Not on file  . Marital Status: Not on file   Vitals:   08/11/20 1058  BP: 118/70  Pulse: 88  Resp: 12  Temp: 97.9 F (36.6 C)  SpO2: 95%   Body mass index is 29.18 kg/m.  Wt Readings from Last 3 Encounters:  08/11/20 170 lb (77.1 kg)  06/24/19 162 lb 4 oz (73.6 kg)  12/18/18 156 lb (70.8 kg)   Physical Exam Vitals and nursing note reviewed.  Constitutional:      General: She is not in acute distress.    Appearance: She is well-developed.  HENT:     Head: Normocephalic and atraumatic.     Right Ear: Hearing, tympanic membrane, ear canal and external ear normal.     Left Ear: Hearing, tympanic membrane, ear canal and external ear normal.     Mouth/Throat:     Mouth: Mucous membranes are moist.     Pharynx: Oropharynx is clear. Uvula midline.  Eyes:     Extraocular Movements: Extraocular movements intact.     Conjunctiva/sclera: Conjunctivae normal.     Pupils: Pupils are equal, round, and reactive to light.  Neck:     Thyroid: Thyromegaly present.     Trachea: No tracheal deviation.  Cardiovascular:      Rate and Rhythm: Normal rate and regular rhythm.     Pulses:          Dorsalis pedis pulses are 2+ on the right side and 2+ on the left side.     Heart sounds: No murmur heard.   Pulmonary:     Effort: Pulmonary effort is normal. No respiratory distress.     Breath sounds: Normal breath sounds.  Abdominal:     Palpations: Abdomen is soft. There is no hepatomegaly or mass.     Tenderness: There is no abdominal tenderness.  Genitourinary:    Comments: Deferred to gyn. Musculoskeletal:     Comments: No major deformity or signs of synovitis appreciated.  Lymphadenopathy:     Cervical: No cervical adenopathy.     Upper Body:     Right upper body: No supraclavicular adenopathy.     Left upper body: No supraclavicular adenopathy.  Skin:    General: Skin is warm.     Findings: No erythema or rash.  Neurological:     Mental Status: She is alert and oriented to person, place, and time.     Cranial Nerves: No cranial nerve deficit.     Coordination: Coordination normal.     Gait: Gait normal.     Deep Tendon Reflexes:     Reflex Scores:      Bicep reflexes are 2+ on the right side and 2+ on the left side.      Patellar reflexes are 2+ on the right side and 2+ on the left side. Psychiatric:        Speech: Speech normal.     Comments: Well groomed, good eye contact.    ASSESSMENT AND PLAN:  Mindy Lowe was here today annual physical examination.  Orders Placed This Encounter  Procedures  . DG Chest 2 View  . Tdap vaccine greater than or equal to 7yo IM  . Flu Vaccine QUAD 36+ mos IM  . VITAMIN D 25 Hydroxy (Vit-D Deficiency, Fractures)  . Lipid panel  . COMPLETE METABOLIC PANEL WITH GFR  . Hemoglobin A1c  . TSH  . Prolactin  Routine general medical examination at a health care facility We discussed the importance of regular physical activity and healthy diet for prevention of chronic illness and/or complications. Preventive guidelines reviewed. Vaccination  updated. Instructed to arrange appt with her gyn for her female preventive care. Next CPE in a year.  Vitamin D deficiency, unspecified Continue Ergocalciferol 50,000 U q 2 weeks, further recommendations according to 25 OH vit D result.  Hyperlipidemia, unspecified hyperlipidemia type Continue non pharmacologic treatment. Further recommendations according to lipid results.  Screening for endocrine, metabolic and immunity disorder -     Hemoglobin A1c; Future  Ulcerative colitis without complications, unspecified location ([place]) Problem seems to be in remission. GI referral placed. She is going to need colonoscopy.  Hyperprolactinemia ([place]) Has been treated and asymptomatic. Further recommendations according to Prolactin results.  Goiter Stable. TSH ordered.  Shortness of breath Hx and examination today do not suggest a serious problem. Instructed about warning signs. CXR ordered.  Hearing loss, unspecified hearing loss type, unspecified laterality Hearing screening test normal.   Hearing Screening   125Hz  250Hz  500Hz  1000Hz  2000Hz  3000Hz  4000Hz  6000Hz  8000Hz   Right ear:   Pass Pass Pass  Pass    Left ear:   Pass Pass Pass  Pass      Need for influenza vaccination -     Flu Vaccine QUAD 36+ mos IM  Need for Tdap vaccination -     Tdap vaccine greater than or equal to 7yo IM  She is not fasting today, will come back for fasting labs.  Return in 1 year (on 08/11/2021) for cpe.  Mindy Janeway G. [place], MD  [facility]. [place] office.  Today you have you routine preventive visit. A few things to remember from today's visit:   Vitamin D deficiency, unspecified - Plan: VITAMIN D 25 Hydroxy (Vit-D Deficiency, Fractures)  Hyperlipidemia, unspecified hyperlipidemia type - Plan: Lipid panel  Routine general medical examination at a health care facility  Screening for endocrine, metabolic and immunity disorder - Plan: COMPLETE METABOLIC PANEL WITH GFR,  Hemoglobin A1c  Ulcerative colitis without complications, unspecified location ([place]), Chronic  Hyperprolactinemia ([place]), Chronic - Plan: Prolactin  Goiter - Plan: TSH  Shortness of breath - Plan: DG Chest 2 View  Hearing loss, unspecified hearing loss type, unspecified laterality  Please schedule appointment with your gyn.  If a new problem present, please set up appointment sooner than planned today.  At least 150 minutes of moderate exercise per week, daily brisk walking for 15-30 min is a good exercise option. Healthy diet low in saturated (animal) fats and sweets and consisting of fresh fruits and vegetables, lean meats Lowe as fish and white chicken and whole grains.  These are some of recommendations for screening depending of age and risk factors:  - Vaccines:  Tdap vaccine every 10 years.  Shingles vaccine recommended at age 56, could be given after 45 years of age but not sure about insurance coverage.   Pneumonia vaccines: Pneumovax at 67. Sometimes Pneumovax is giving earlier if history of smoking, lung disease,diabetes,kidney disease among some.  Screening for diabetes at age 81 and every 3 years.  Cervical cancer prevention:  Pap smear starts at 45 years of age and continues periodically until 45 years old in low risk women. Pap smear every 3 years between 9 and 24 years old. Pap smear every 3-5 years between women 79 and older if pap smear negative and HPV screening negative.   -Breast cancer: Mammogram: There is disagreement between  experts about when to start screening in low risk asymptomatic female but recent recommendations are to start screening at 33 and not later than 45 years old , every 1-2 years and after 45 yo q 2 years. Screening is recommended until 45 years old but some women can continue screening depending of healthy issues.  Colon cancer screening: Has been recently changed to 45 yo. Insurance may not cover until you are 45 years old. Screening  is recommended until 45 years old.  Cholesterol disorder screening at age 43 and every 3 years.N/A  Also recommended:  1. Dental visit- Brush and floss your teeth twice daily; visit your dentist twice a year. 2. Eye doctor- Get an eye exam at least every 2 years. 3. Helmet use- Always wear a helmet when riding a bicycle, motorcycle, rollerblading or skateboarding. 4. Safe sex- If you may be exposed to sexually transmitted infections, use a condom. 5. Seat belts- Seat belts can save your live; always wear one. 6. Smoke/Carbon Monoxide detectors- These detectors need to be installed on the appropriate level of your home. Replace batteries at least once a year. 7. Skin cancer- When out in the sun please cover up and use sunscreen 15 SPF or higher. 8. Violence- If anyone is threatening or hurting you, please tell your healthcare provider.  9. Drink alcohol in moderation- Limit alcohol intake to one drink or less per day. Never drink and drive. 10. Calcium supplementation 1000 to 1200 mg daily, ideally through your diet.  Vitamin D supplementation 800 units daily.

## 2020-08-11 ENCOUNTER — Ambulatory Visit: Payer: 59

## 2020-08-11 ENCOUNTER — Encounter: Payer: Self-pay | Admitting: Family Medicine

## 2020-08-11 ENCOUNTER — Ambulatory Visit (INDEPENDENT_AMBULATORY_CARE_PROVIDER_SITE_OTHER): Payer: 59 | Admitting: Family Medicine

## 2020-08-11 ENCOUNTER — Other Ambulatory Visit: Payer: Self-pay

## 2020-08-11 VITALS — BP 118/70 | HR 88 | Temp 97.9°F | Resp 12 | Ht 64.0 in | Wt 170.0 lb

## 2020-08-11 DIAGNOSIS — K5190 Ulcerative colitis, unspecified, without complications: Secondary | ICD-10-CM

## 2020-08-11 DIAGNOSIS — Z130 Encounter for screening for diseases of the blood and blood-forming organs and certain disorders involving the immune mechanism: Secondary | ICD-10-CM

## 2020-08-11 DIAGNOSIS — Z23 Encounter for immunization: Secondary | ICD-10-CM | POA: Diagnosis not present

## 2020-08-11 DIAGNOSIS — Z13228 Encounter for screening for other metabolic disorders: Secondary | ICD-10-CM

## 2020-08-11 DIAGNOSIS — E049 Nontoxic goiter, unspecified: Secondary | ICD-10-CM

## 2020-08-11 DIAGNOSIS — H9190 Unspecified hearing loss, unspecified ear: Secondary | ICD-10-CM

## 2020-08-11 DIAGNOSIS — E221 Hyperprolactinemia: Secondary | ICD-10-CM

## 2020-08-11 DIAGNOSIS — E785 Hyperlipidemia, unspecified: Secondary | ICD-10-CM

## 2020-08-11 DIAGNOSIS — Z1329 Encounter for screening for other suspected endocrine disorder: Secondary | ICD-10-CM | POA: Diagnosis not present

## 2020-08-11 DIAGNOSIS — R0602 Shortness of breath: Secondary | ICD-10-CM

## 2020-08-11 DIAGNOSIS — Z0000 Encounter for general adult medical examination without abnormal findings: Secondary | ICD-10-CM

## 2020-08-11 DIAGNOSIS — E559 Vitamin D deficiency, unspecified: Secondary | ICD-10-CM

## 2020-08-11 NOTE — Patient Instructions (Signed)
Today you have you routine preventive visit. A few things to remember from today's visit:   Vitamin D deficiency, unspecified - Plan: VITAMIN D 25 Hydroxy (Vit-D Deficiency, Fractures)  Hyperlipidemia, unspecified hyperlipidemia type - Plan: Lipid panel  Routine general medical examination at a health care facility  Screening for endocrine, metabolic and immunity disorder - Plan: COMPLETE METABOLIC PANEL WITH GFR, Hemoglobin A1c  Ulcerative colitis without complications, unspecified location ([place]), Chronic  Hyperprolactinemia ([place]), Chronic - Plan: Prolactin  Goiter - Plan: TSH  Shortness of breath - Plan: DG Chest 2 View  Hearing loss, unspecified hearing loss type, unspecified laterality  Please schedule appointment with your gyn.  If a new problem present, please set up appointment sooner than planned today.  At least 150 minutes of moderate exercise per week, daily brisk walking for 15-30 min is a good exercise option. Healthy diet low in saturated (animal) fats and sweets and consisting of fresh fruits and vegetables, lean meats such as fish and white chicken and whole grains.  These are some of recommendations for screening depending of age and risk factors:  - Vaccines:  Tdap vaccine every 10 years.  Shingles vaccine recommended at age 45, could be given after 45 years of age but not sure about insurance coverage.   Pneumonia vaccines: Pneumovax at 37. Sometimes Pneumovax is giving earlier if history of smoking, lung disease,diabetes,kidney disease among some.  Screening for diabetes at age 15 and every 3 years.  Cervical cancer prevention:  Pap smear starts at 45 years of age and continues periodically until 45 years old in low risk women. Pap smear every 3 years between 32 and 67 years old. Pap smear every 3-5 years between women 29 and older if pap smear negative and HPV screening negative.   -Breast cancer: Mammogram: There is disagreement between experts  about when to start screening in low risk asymptomatic female but recent recommendations are to start screening at 90 and not later than 45 years old , every 1-2 years and after 45 yo q 2 years. Screening is recommended until 45 years old but some women can continue screening depending of healthy issues.  Colon cancer screening: Has been recently changed to 45 yo. Insurance may not cover until you are 45 years old. Screening is recommended until 45 years old.  Cholesterol disorder screening at age 69 and every 3 years.N/A  Also recommended:  1. Dental visit- Brush and floss your teeth twice daily; visit your dentist twice a year. 2. Eye doctor- Get an eye exam at least every 2 years. 3. Helmet use- Always wear a helmet when riding a bicycle, motorcycle, rollerblading or skateboarding. 4. Safe sex- If you may be exposed to sexually transmitted infections, use a condom. 5. Seat belts- Seat belts can save your live; always wear one. 6. Smoke/Carbon Monoxide detectors- These detectors need to be installed on the appropriate level of your home. Replace batteries at least once a year. 7. Skin cancer- When out in the sun please cover up and use sunscreen 15 SPF or higher. 8. Violence- If anyone is threatening or hurting you, please tell your healthcare provider.  9. Drink alcohol in moderation- Limit alcohol intake to one drink or less per day. Never drink and drive. 10. Calcium supplementation 1000 to 1200 mg daily, ideally through your diet.  Vitamin D supplementation 800 units daily.

## 2020-08-26 ENCOUNTER — Other Ambulatory Visit: Payer: 59

## 2020-08-26 ENCOUNTER — Other Ambulatory Visit: Payer: Self-pay

## 2020-08-26 DIAGNOSIS — E049 Nontoxic goiter, unspecified: Secondary | ICD-10-CM

## 2020-08-26 DIAGNOSIS — E785 Hyperlipidemia, unspecified: Secondary | ICD-10-CM

## 2020-08-26 DIAGNOSIS — Z130 Encounter for screening for diseases of the blood and blood-forming organs and certain disorders involving the immune mechanism: Secondary | ICD-10-CM

## 2020-08-26 DIAGNOSIS — Z13228 Encounter for screening for other metabolic disorders: Secondary | ICD-10-CM

## 2020-08-26 DIAGNOSIS — E559 Vitamin D deficiency, unspecified: Secondary | ICD-10-CM

## 2020-08-26 DIAGNOSIS — E221 Hyperprolactinemia: Secondary | ICD-10-CM

## 2020-08-27 LAB — COMPLETE METABOLIC PANEL WITH GFR
AG Ratio: 1.5 (calc) (ref 1.0–2.5)
ALT: 20 U/L (ref 6–29)
AST: 20 U/L (ref 10–35)
Albumin: 4.1 g/dL (ref 3.6–5.1)
Alkaline phosphatase (APISO): 64 U/L (ref 31–125)
BUN: 21 mg/dL (ref 7–25)
CO2: 26 mmol/L (ref 20–32)
Calcium: 9.1 mg/dL (ref 8.6–10.2)
Chloride: 104 mmol/L (ref 98–110)
Creat: 0.87 mg/dL (ref 0.50–1.10)
GFR, Est African American: 93 mL/min/{1.73_m2} (ref >=60)
GFR, Est Non African American: 80 mL/min/{1.73_m2} (ref >=60)
Globulin: 2.8 g/dL (calc) (ref 1.9–3.7)
Glucose, Bld: 88 mg/dL (ref 65–99)
Potassium: 4.6 mmol/L (ref 3.5–5.3)
Sodium: 137 mmol/L (ref 135–146)
Total Bilirubin: 0.3 mg/dL (ref 0.2–1.2)
Total Protein: 6.9 g/dL (ref 6.1–8.1)

## 2020-08-27 LAB — HEMOGLOBIN A1C
Hgb A1c MFr Bld: 5.4 % of total Hgb (ref <5.7)
Mean Plasma Glucose: 108 (calc)
eAG (mmol/L): 6 (calc)

## 2020-08-27 LAB — PROLACTIN: Prolactin: 29.6 ng/mL

## 2020-08-27 LAB — VITAMIN D 25 HYDROXY (VIT D DEFICIENCY, FRACTURES): Vit D, 25-Hydroxy: 45 ng/mL (ref 30–100)

## 2020-08-27 LAB — LIPID PANEL
Cholesterol: 207 mg/dL — ABNORMAL HIGH (ref <200)
HDL: 55 mg/dL (ref >=50)
LDL Cholesterol (Calc): 127 mg/dL (calc) — ABNORMAL HIGH
Non-HDL Cholesterol (Calc): 152 mg/dL (calc) — ABNORMAL HIGH (ref <130)
Total CHOL/HDL Ratio: 3.8 (calc) (ref <5.0)
Triglycerides: 142 mg/dL (ref <150)

## 2020-08-27 LAB — TSH: TSH: 3.38 mIU/L

## 2020-09-10 ENCOUNTER — Telehealth: Payer: Self-pay | Admitting: Family Medicine

## 2020-09-10 NOTE — Telephone Encounter (Signed)
Pt is calling in to see if she can get her lab results.

## 2020-09-14 NOTE — Telephone Encounter (Signed)
See result note.  

## 2020-09-15 ENCOUNTER — Ambulatory Visit (INDEPENDENT_AMBULATORY_CARE_PROVIDER_SITE_OTHER): Payer: 59

## 2020-09-15 ENCOUNTER — Other Ambulatory Visit: Payer: Self-pay | Admitting: Family Medicine

## 2020-09-15 ENCOUNTER — Other Ambulatory Visit: Payer: 59

## 2020-09-15 ENCOUNTER — Other Ambulatory Visit: Payer: Self-pay

## 2020-09-15 DIAGNOSIS — R0602 Shortness of breath: Secondary | ICD-10-CM

## 2020-10-06 ENCOUNTER — Other Ambulatory Visit: Payer: 59

## 2020-10-08 ENCOUNTER — Encounter: Payer: Self-pay | Admitting: *Deleted

## 2020-10-09 ENCOUNTER — Telehealth: Payer: Self-pay | Admitting: Family Medicine

## 2020-10-09 NOTE — Telephone Encounter (Signed)
Patient is calling to see when x ray results were back yet, please advise. CB is [phone]

## 2020-10-09 NOTE — Telephone Encounter (Signed)
Spoke with patient informed her that the results were back and a copy was mailed to her on 10/08/20. No further questions

## 2020-12-18 ENCOUNTER — Other Ambulatory Visit: Payer: Self-pay | Admitting: Family Medicine

## 2021-02-04 ENCOUNTER — Telehealth: Payer: 59 | Admitting: Family Medicine

## 2021-02-05 ENCOUNTER — Encounter: Payer: Self-pay | Admitting: Family Medicine

## 2021-02-05 ENCOUNTER — Telehealth (INDEPENDENT_AMBULATORY_CARE_PROVIDER_SITE_OTHER): Payer: 59 | Admitting: Family Medicine

## 2021-02-05 DIAGNOSIS — B372 Candidiasis of skin and nail: Secondary | ICD-10-CM | POA: Diagnosis not present

## 2021-02-05 DIAGNOSIS — J069 Acute upper respiratory infection, unspecified: Secondary | ICD-10-CM

## 2021-02-05 DIAGNOSIS — N63 Unspecified lump in unspecified breast: Secondary | ICD-10-CM

## 2021-02-05 MED ORDER — NYSTATIN 100000 UNIT/GM EX CREA: 1.0000 "application " | TOPICAL_CREAM | Freq: Two times a day (BID) | CUTANEOUS | 0 refills | Status: DC

## 2021-02-05 NOTE — Progress Notes (Signed)
Virtual Visit via Video Note  I connected with Mindy Lowe on 02/05/21 at  3:30 PM EDT by a video enabled telemedicine application 2/2 [id number] pandemic and verified that I am speaking with the correct person using two identifiers.  Location patient: home Location provider:work or home office Persons participating in the virtual visit: patient, provider  I discussed the limitations of evaluation and management by telemedicine and the availability of in person appointments. The patient expressed understanding and agreed to proceed.   HPI: Pt is a 45 yo female with pmh sig for migraines, PCOS, h/o hyperprolactinemia, goiter, HLD, h/o vit D def, h/o interstitial cystitis, endometriosis who is followed by Dr. [place] and seen today for acute concern.  Pt noticed uncontrolled pruritis on L breast x 2-2.5 wks ago.  Changed detergent as she thought that may be causing the symptoms.  Developed a bump near nipple.  Tried alcohol and Neosporin.  The area drained clear fluid and started to flatten out but the neosporin caused it to get worse.  Now noticing pruritic small red bumps and rash underneath L breast and side of breast x 6 days.  Pt also notes palpating a bump within the L lateral breast.  Patient endorses working out more and staying in sweaty clothes.   Pt denies fever, chills.  No recent mammogram.  Pt with acute illness after returning from [place] on Monday 5/2.   Pt developed throat irritation, ear pain/ pressure, nasal drainage, yellow phlegm, cough, lost of taste.  Pt has home COVID tests but unable to locate them at this time.  Pt had COVID infection around new year's.  Also had 2 doses of covid-19 vaccine.  Was waiting to get a booster.   Pt is caring for her mother.  ROS: See pertinent positives and negatives per HPI.  Past Medical History:  Diagnosis Date  . Colitis, ulcerative chronic (HCC)    Dr Terance Hart  . Cystitis, interstitial   . Endometriosis   . Goiter   .  Hyperprolactinemia ([place])   . Migraine aura without headache   . Polycystic ovarian syndrome     Past Surgical History:  Procedure Laterality Date  . APPENDECTOMY    . DIAGNOSTIC LAPAROSCOPY      Family History  Problem Relation Age of Onset  . Lung cancer Maternal Grandfather   . Cancer Maternal Grandfather   . Ovarian cancer Paternal Grandmother        then developed pancreatic  . Cancer Paternal Grandmother   . Diabetes Paternal Grandmother   . Hyperlipidemia Mother   . Hypertension Mother   . Arthritis Mother   . Diabetes Father   . Early death Father   . Heart attack Father   . Early death Maternal Grandmother     Current Outpatient Medications:  .  aspirin 325 MG tablet, Take 325 mg by mouth as needed for pain., Disp: , Rfl:  .  CALCIUM PO, Take 400 mg by mouth. 2 capsules daily, Disp: , Rfl:  .  CRANBERRY PO, Take by mouth. 2 a day, Disp: , Rfl:  .  Multiple Vitamin (MULTIVITAMIN) tablet, Take 1 tablet by mouth daily., Disp: , Rfl:  .  NON FORMULARY, , Disp: , Rfl:  .  Vitamin D, Ergocalciferol, (DRISDOL) 1.25 MG (50000 UNIT) CAPS capsule, TAKE 1 CAPSULE BY MOUTH EVERY 7 DAYS, Disp: 12 capsule, Rfl: 0 .  Diclofenac Sodium (PENNSAID) 2 % SOLN, Place 2 g onto the skin 2 (two) times daily., Disp: 112 g,  Rfl: 3 .  Evening Primrose Oil 1000 MG CAPS, Take by mouth daily., Disp: , Rfl:  .  ibuprofen (ADVIL,MOTRIN) 200 MG tablet, Take 200 mg by mouth every 6 (six) hours as needed., Disp: , Rfl:  .  Lactobacillus (ACIDOPHILUS PO), Take by mouth. 2 a day, Disp: , Rfl:  .  spironolactone (ALDACTONE) 50 MG tablet, Take by mouth. (Patient not taking: Reported on 02/05/2021), Disp: , Rfl:   EXAM:  VITALS per patient if applicable: RR between 28-41 bpm  GENERAL: alert, oriented, appears well and in no acute distress  HEENT: atraumatic, conjunctiva clear, no obvious abnormalities on inspection of external nose and ears  NECK: normal movements of the head and neck  LUNGS:  Intermittent cough.  On inspection no signs of respiratory distress, breathing rate appears normal, no obvious gross SOB, gasping or wheezing  CV: no obvious cyanosis  MS: moves all visible extremities without noticeable abnormality  PSYCH/NEURO: pleasant and cooperative, no obvious depression or anxiety, speech and thought processing grossly intact  ASSESSMENT AND PLAN:  Discussed the following assessment and plan:  Candidal dermatitis  -Description of rash on the lateral left breast and underneath left breast sounds like yeast dermatitis.  Discussed need for in person evaluation as unable to visualize rash during E-visit. -We will start nystatin cream -Discussed keeping skin clean and dry -Initial area of concern may also be caused by yeast.  Will monitor for resolution of symptoms with nystatin cream and reevaluate in person in 1-2 weeks. - Plan: nystatin cream (MYCOSTATIN)  Viral URI with cough -Discussed possible causes including viral etiology such as acute nasopharyngitis, influenza, COVID-19 virus infection -Patient encouraged to have COVID testing with either at home test or at local testing site. -Symptomatic treatment encouraged including gargling with warm salt water, Tylenol, rest, hydration, OTC cough/cold medication -If COVID testing positive may be outside of the window for antiviral medication such as molnupiravir -Given precautions  Lump in female breast -discussed possible causes -Will benefit from in person visit to further evaluate. -Discussed screening mammogram versus diagnostic mammogram based on in person evaluation -Given precautions  Follow-up in 1-2 weeks, sooner if needed  I discussed the assessment and treatment plan with the patient. The patient was provided an opportunity to ask questions and all were answered. The patient agreed with the plan and demonstrated an understanding of the instructions.   The patient was advised to call back or seek an  in-person evaluation if the symptoms worsen or if the condition fails to improve as anticipated.  Billie Ruddy, MD

## 2021-02-10 ENCOUNTER — Telehealth: Payer: Self-pay | Admitting: Family Medicine

## 2021-02-10 NOTE — Telephone Encounter (Signed)
Pt call and stated dr.Banks wanted to know her covid test result and it was negative the patient want to know if dr.Banks could call her in a antibiotic at  [facility], [place] - [address] AT [place] Phone:  [phone]  Fax:  [phone]

## 2021-02-11 NOTE — Telephone Encounter (Signed)
Pt is calling in to check the status of the below msg and is aware that the provider has it and will address.

## 2021-02-12 IMAGING — DX DG CHEST 2V
2 series · 2 of 2 positions shown · non-contrast
Comparison: None.

CLINICAL DATA: Chest pain and shortness of breath

EXAM:
CHEST - 2 VIEW

[chest pa]
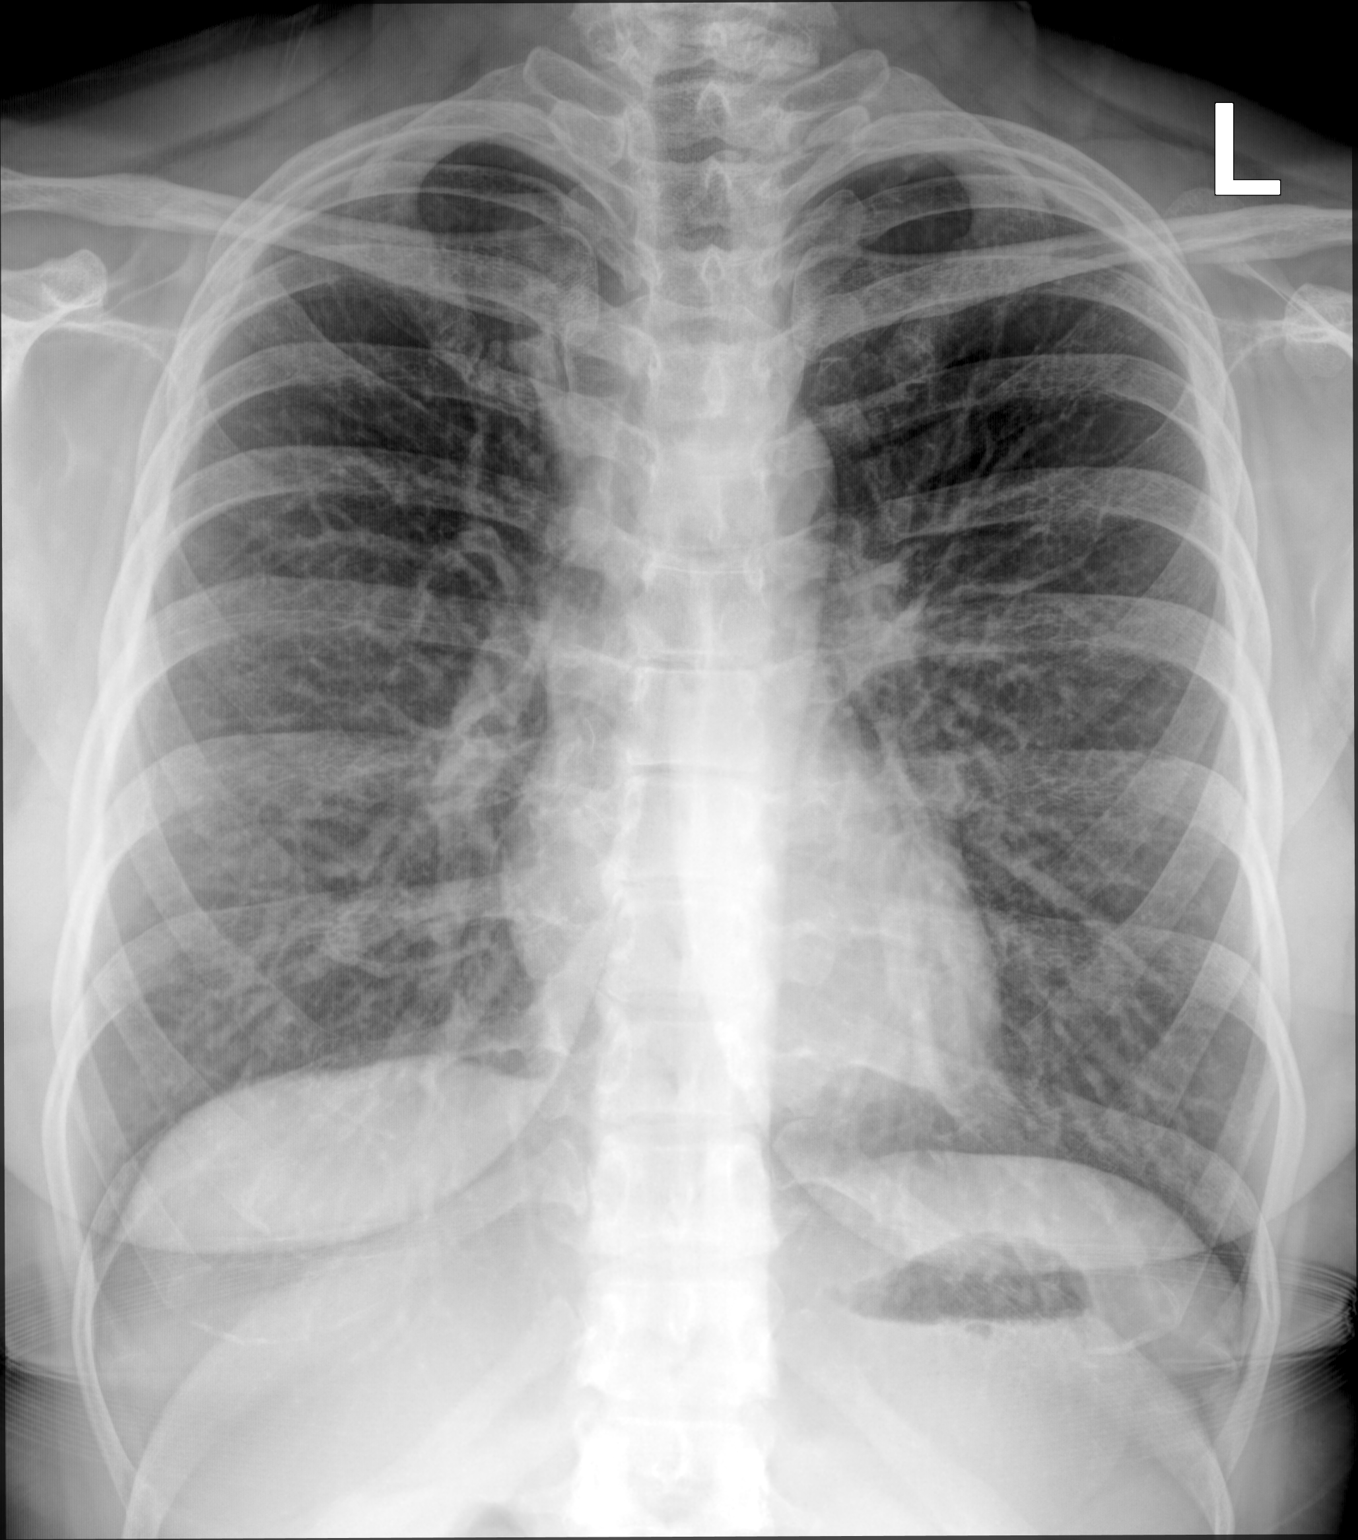

[chest lat]
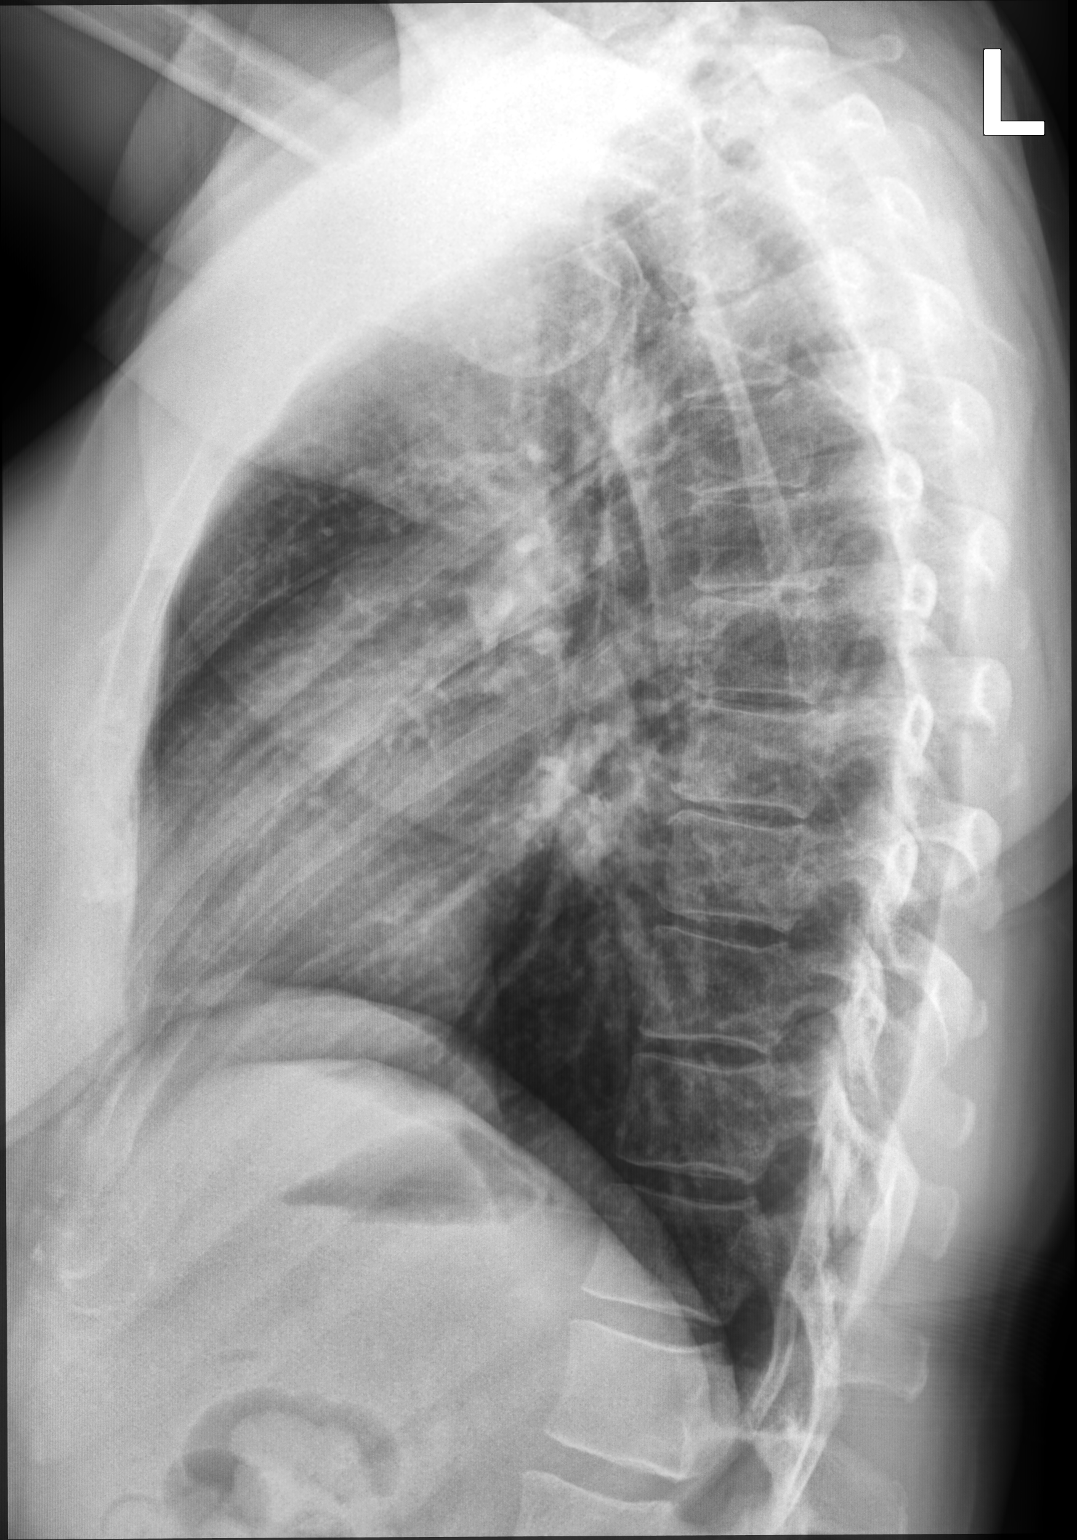

[2 of 2 positions shown; findings below may reference images not displayed]

FINDINGS: Lungs are clear. Heart size and pulmonary vascularity are normal. No
adenopathy. No pneumothorax. No bone lesions.
IMPRESSION: Lungs clear.  Cardiac silhouette normal.

## 2021-02-13 ENCOUNTER — Encounter: Payer: Self-pay | Admitting: Family Medicine

## 2021-02-19 ENCOUNTER — Other Ambulatory Visit: Payer: Self-pay | Admitting: Family Medicine

## 2021-02-22 ENCOUNTER — Other Ambulatory Visit: Payer: Self-pay

## 2021-02-22 ENCOUNTER — Emergency Department (HOSPITAL_BASED_OUTPATIENT_CLINIC_OR_DEPARTMENT_OTHER)
Admission: EM | Admit: 2021-02-22 | Discharge: 2021-02-22 | Disposition: A | Discharge status: Home / Self Care | Payer: 59 | Attending: Emergency Medicine | Admitting: Emergency Medicine

## 2021-02-22 ENCOUNTER — Encounter (HOSPITAL_BASED_OUTPATIENT_CLINIC_OR_DEPARTMENT_OTHER): Payer: Self-pay

## 2021-02-22 ENCOUNTER — Emergency Department (HOSPITAL_BASED_OUTPATIENT_CLINIC_OR_DEPARTMENT_OTHER): Payer: 59

## 2021-02-22 DIAGNOSIS — M79621 Pain in right upper arm: Secondary | ICD-10-CM | POA: Insufficient documentation

## 2021-02-22 DIAGNOSIS — M25511 Pain in right shoulder: Secondary | ICD-10-CM | POA: Insufficient documentation

## 2021-02-22 DIAGNOSIS — R079 Chest pain, unspecified: Secondary | ICD-10-CM | POA: Diagnosis present

## 2021-02-22 LAB — TROPONIN I (HIGH SENSITIVITY)
Troponin I (High Sensitivity): <2 ng/L (ref <18)
Troponin I (High Sensitivity): <2 ng/L (ref <18)

## 2021-02-22 LAB — CBC
HCT: 41.9 % (ref 36.0–46.0)
Hemoglobin: 14.4 g/dL (ref 12.0–15.0)
MCH: 32.9 pg (ref 26.0–34.0)
MCHC: 34.4 g/dL (ref 30.0–36.0)
MCV: 95.7 fL (ref 80.0–100.0)
Platelets: 382 10*3/uL (ref 150–400)
RBC: 4.38 MIL/uL (ref 3.87–5.11)
RDW: 12.9 % (ref 11.5–15.5)
WBC: 7.9 10*3/uL (ref 4.0–10.5)
nRBC: 0 % (ref 0.0–0.2)

## 2021-02-22 LAB — BASIC METABOLIC PANEL
Anion gap: 8 (ref 5–15)
BUN: 13 mg/dL (ref 6–20)
CO2: 25 mmol/L (ref 22–32)
Calcium: 9.1 mg/dL (ref 8.9–10.3)
Chloride: 100 mmol/L (ref 98–111)
Creatinine, Ser: 0.82 mg/dL (ref 0.44–1.00)
GFR, Estimated: >60 mL/min (ref >60)
Glucose, Bld: 95 mg/dL (ref 70–99)
Potassium: 3.3 mmol/L — ABNORMAL LOW (ref 3.5–5.1)
Sodium: 133 mmol/L — ABNORMAL LOW (ref 135–145)

## 2021-02-22 LAB — PREGNANCY, URINE: Preg Test, Ur: NEGATIVE

## 2021-02-22 MED ORDER — ACETAMINOPHEN 500 MG PO TABS
1000.0000 mg | ORAL_TABLET | Freq: Once | ORAL | Status: AC
  Administered 2021-02-22: 1000 mg via ORAL
  Filled 2021-02-22: qty 2

## 2021-02-22 NOTE — ED Provider Notes (Signed)
[facility] EMERGENCY DEPARTMENT Provider Note   CSN: [id number] Arrival date & time: 02/22/21  1509     History Chief Complaint  Patient presents with  . Chest Pain    Mindy Lowe is a 45 y.o. female.  HPI  Patient is a 45 year old female with past medical history significant for PCOS, endometriosis, chronic colitis  Patient presented today with right-sided shoulder, anterior chest and right upper arm pain that began 1 hour prior to arrival.  She states that she was typing at her computer when she had a sudden stabbing pain in her right shoulder.  It seemed to spread to her right-sided chest.  She denies any associated nausea, vomiting, shortness of breath lightheadedness or dizziness.  She states that she laid down in her bed but the pain seem to keep getting worse.  She took 2 ibuprofen and the pain lasted for approximately 1.5 hours and then gradually improved. She states that her symptoms have significantly improved at this time.  She denies any numbness or weakness in her arm.  No back or neck pain.  No recent surgeries, hospitalization, long travel, hemoptysis, estrogen containing OCP, cancer history.  No unilateral leg swelling.  No history of PE or VTE.    She denies any exertional symptoms.  She does states the pain is not pleuritic.  Denies any leg pain or swelling.    Past Medical History:  Diagnosis Date  . Colitis, ulcerative chronic (HCC)    Dr Terance Hart  . Cystitis, interstitial   . Endometriosis   . Goiter   . Hyperprolactinemia ([place])   . Migraine aura without headache   . Polycystic ovarian syndrome     Patient Active Problem List   Diagnosis Date Noted  . Vitamin D deficiency, unspecified 12/18/2018  . SI (sacroiliac) joint dysfunction 10/31/2017  . Nonallopathic lesion of sacral region 10/31/2017  . Nonallopathic lesion of thoracic region 10/31/2017  . Nonallopathic lesion of lumbosacral region 10/31/2017  . Low back pain 10/31/2016  .  Endometriosis   . Polycystic ovarian syndrome   . Hyperprolactinemia ([place])   . Migraine aura without headache   . Cystitis, interstitial   . Goiter 03/23/2009  . CALF PAIN, LEFT 03/23/2009  . ACUTE CYSTITIS 11/04/2008  . SINUSITIS- ACUTE-NOS 08/21/2008  . HYPERLIPIDEMIA 11/20/2007  . ANEMIA, IRON DEFICIENCY 11/20/2007  . COLITIS, ULCERATIVE NOS 07/23/2007  . ENDOMETRIOSIS NEC 07/23/2007  . PAIN IN JOINT, UNSPECIFIED SITE 07/23/2007  . MYALGIA 07/23/2007  . Abdominal pain, other specified site 07/23/2007    Past Surgical History:  Procedure Laterality Date  . APPENDECTOMY    . DIAGNOSTIC LAPAROSCOPY       OB History    Gravida  0   Para  0   Term  0   Preterm  0   AB  0   Living  0     SAB  0   IAB  0   Ectopic  0   Multiple  0   Live Births              Family History  Problem Relation Age of Onset  . Lung cancer Maternal Grandfather   . Cancer Maternal Grandfather   . Ovarian cancer Paternal Grandmother        then developed pancreatic  . Cancer Paternal Grandmother   . Diabetes Paternal Grandmother   . Hyperlipidemia Mother   . Hypertension Mother   . Arthritis Mother   . Diabetes Father   .  Early death Father   . Heart attack Father   . Early death Maternal Grandmother     Social History   Tobacco Use  . Smoking status: Never Smoker  . Smokeless tobacco: Never Used  Vaping Use  . Vaping Use: Never used  Substance Use Topics  . Alcohol use: Yes    Alcohol/week: 0.0 standard drinks    Comment: socially 1-2 a month  . Drug use: No    Home Medications Prior to Admission medications   Medication Sig Start Date End Date Taking? Authorizing Provider  aspirin 325 MG tablet Take 325 mg by mouth as needed for pain.    [provider]  CALCIUM PO Take 400 mg by mouth. 2 capsules daily    [provider]  CRANBERRY PO Take by mouth. 2 a day    [provider]  Diclofenac Sodium (PENNSAID) 2 % SOLN Place 2 g  onto the skin 2 (two) times daily. 10/31/17   Lyndal Pulley, DO  Evening Primrose Oil 1000 MG CAPS Take by mouth daily.    [provider]  ibuprofen (ADVIL,MOTRIN) 200 MG tablet Take 200 mg by mouth every 6 (six) hours as needed.    [provider]  Lactobacillus (ACIDOPHILUS PO) Take by mouth. 2 a day    [provider]  Multiple Vitamin (MULTIVITAMIN) tablet Take 1 tablet by mouth daily.    [provider]  NON FORMULARY     [provider]  nystatin cream (MYCOSTATIN) Apply 1 application topically 2 (two) times daily. 02/05/21   Billie Ruddy, MD  spironolactone (ALDACTONE) 50 MG tablet Take by mouth. Patient not taking: Reported on 02/05/2021 08/05/15   [provider]  Vitamin D, Ergocalciferol, (DRISDOL) 1.25 MG (50000 UNIT) CAPS capsule TAKE 1 CAPSULE BY MOUTH EVERY 7 DAYS 02/22/21   Martin[place], Betty G, MD    Allergies    Codeine  Review of Systems   Review of Systems  Constitutional: Negative for chills and fever.  HENT: Negative for congestion.   Eyes: Negative for pain.  Respiratory: Negative for cough and shortness of breath.   Cardiovascular: Positive for chest pain. Negative for leg swelling.  Gastrointestinal: Negative for abdominal pain, diarrhea, nausea and vomiting.  Genitourinary: Negative for dysuria.  Musculoskeletal: Negative for myalgias.       Right shoulder/arm/chest pain  Skin: Negative for rash.  Neurological: Negative for dizziness and headaches.    Physical Exam Updated Vital Signs BP 124/83   Pulse 73   Temp 97.8 F (36.6 C) (Oral)   Resp 14   Ht 5\' 4"  (1.626 m)   Wt 72.6 kg   LMP 02/01/2021 (Approximate)   SpO2 100%   BMI 27.46 kg/m   Physical Exam Vitals and nursing note reviewed.  Constitutional:      General: She is not in acute distress.    Comments: Pleasant well-appearing 45 year old.  In no acute distress.  Sitting comfortably in bed.  Able answer questions appropriately follow  commands. No increased work of breathing. Speaking in full sentences.  HENT:     Head: Normocephalic and atraumatic.     Nose: Nose normal.     Mouth/Throat:     Mouth: Mucous membranes are moist.  Eyes:     General: No scleral icterus. Cardiovascular:     Rate and Rhythm: Normal rate and regular rhythm.     Pulses: Normal pulses.     Heart sounds: Normal heart sounds.  Comments: No murmurs rubs or gallops. Pulmonary:     Effort: Pulmonary effort is normal. No respiratory distress.     Breath sounds: Normal breath sounds. No wheezing.  Abdominal:     Palpations: Abdomen is soft.     Tenderness: There is no abdominal tenderness. There is no guarding or rebound.  Musculoskeletal:     Cervical back: Normal range of motion.     Right lower leg: No edema.     Left lower leg: No edema.  Skin:    General: Skin is warm and dry.     Capillary Refill: Capillary refill takes less than 2 seconds.  Neurological:     Mental Status: She is alert. Mental status is at baseline.  Psychiatric:        Mood and Affect: Mood normal.        Behavior: Behavior normal.     ED Results / Procedures / Treatments   Labs (all labs ordered are listed, but only abnormal results are displayed) Labs Reviewed  BASIC METABOLIC PANEL - Abnormal; Notable for the following components:      Result Value   Sodium 133 (*)    Potassium 3.3 (*)    All other components within normal limits  CBC  PREGNANCY, URINE  TROPONIN I (HIGH SENSITIVITY)  TROPONIN I (HIGH SENSITIVITY)    EKG EKG Interpretation  Date/Time:  Monday Feb 22 2021 15:22:36 EDT Ventricular Rate:  75 PR Interval:  170 QRS Duration: 86 QT Interval:  384 QTC Calculation: 428 R Axis:   71 Text Interpretation: Normal sinus rhythm Normal ECG No old tracing for comparison Confirmed by Nanda Quinton [phone]) on 02/22/2021 3:24:19 PM   Radiology DG Chest 2 View  Result Date: 02/22/2021 CLINICAL DATA:  Right-sided chest pain. EXAM: CHEST - 2  VIEW COMPARISON:  09/15/2020 FINDINGS: The cardiomediastinal contours are normal. The lungs are clear. Pulmonary vasculature is normal. No consolidation, pleural effusion, or pneumothorax. No acute osseous abnormalities are seen. IMPRESSION: Negative radiographs of the chest. Electronically Signed   By: Keith Rake M.D.   On: 02/22/2021 15:53    Procedures Procedures   Medications Ordered in ED Medications  acetaminophen (TYLENOL) tablet 1,000 mg (1,000 mg Oral Given 02/22/21 1752)    ED Course  I have reviewed the triage vital signs and the nursing notes.  Pertinent labs & imaging results that were available during my care of the patient were reviewed by me and considered in my medical decision making (see chart for details).    MDM Rules/Calculators/A&P                          Patient is a 45 year old female with a past medical history detailed in HPI presented today with right shoulder pain and seemed to spread to her right upper chest.  She gestures to approximately her clavicle when she is describing the area of pain.  Seems that it was sharp and spasm-like lasted for an hour and a half but resolved with ibuprofen.  Her exam is relatively benign with bilateral radial artery pulses symmetric.  I was unable to elicit any tenderness to palpation of the chest but there was some tenderness of the right trapezius  Troponin x2 within normal limits.  BMP unremarkable apart from barely low hypokalemia.  Pregnancy negative.  CBC for leukocytosis or anemia.  EKG with no significant ST-T wave abnormalities.  Normal sinus rhythm, no significant axis deviations.  No S1Q3T3 and  apart from some inferior Q waves no abnormalities.  Patient is PERC negative I have low suspicion for pulmonary embolism.  She has no significant risk factors for ACS.  Her chest pain is overall very atypical doubt dissection, tamponade or myocarditis.  Pericarditis is certainly an option/possibility given her recent  viral sounding illness.  It is not positional pain however.  No evidence of effusion on chest x-ray.  Agree with radiologist read no acute abnormality on chest x-ray.  Patient discharged home with strict return precautions.  She will follow-up with PCP.  All questions answered best my ability.  Mindy Lowe was evaluated in Emergency Department on 02/22/2021 for the symptoms described in the history of present illness. She was evaluated in the context of the global COVID-19 pandemic, which necessitated consideration that the patient might be at risk for infection with the SARS-CoV-2 virus that causes COVID-19. Institutional protocols and algorithms that pertain to the evaluation of patients at risk for COVID-19 are in a state of rapid change based on information released by regulatory bodies including the CDC and federal and state organizations. These policies and algorithms were followed during the patient's care in the ED.   Final Clinical Impression(s) / ED Diagnoses Final diagnoses:  Chest pain, unspecified type  Acute pain of right shoulder    Rx / DC Orders ED Discharge Orders    None       Tedd Sias, [place] 02/22/21 Berneice Gandy, MD 02/25/21 [phone]

## 2021-02-22 NOTE — ED Triage Notes (Addendum)
Pt c/o right side CP, right UE pain x 1 hour-sx started while seated/working-denies flu sx <2weeks-states she was dx with bronchitis recently taking last abx today-NAD-steady gait

## 2021-02-22 NOTE — Discharge Instructions (Signed)
Please follow-up with your primary care doctor for follow-up after today's visit.  Your cardiac enzymes were both normal.  Your EKG is without any acute abnormality.  Your symptoms and work-up today are not consistent with a heart attack.  I have very low suspicion for a serious or life-threatening illness.  I have a relatively high suspicion for a muscular injury or muscle spasm although, as we discussed, this is certainly not a slam dunk diagnosis given that you are not doing any strenuous heavy lifting today or yesterday.  Please use Tylenol or ibuprofen for pain.  You may use 600 mg ibuprofen every 6 hours or 1000 mg of Tylenol every 6 hours.  You may choose to alternate between the 2.  This would be most effective.  Not to exceed 4 g of Tylenol within 24 hours.  Not to exceed 3200 mg ibuprofen 24 hours.  \ You may always return to the ER for new or concerning symptoms specifically if you get any chest pain or shortness of breath, nausea or vomiting, or begin coughing up blood.  If you spike a fever or experience any other new or concerning symptoms

## 2021-02-22 NOTE — ED Notes (Signed)
ED Provider at bedside. 

## 2021-03-02 ENCOUNTER — Other Ambulatory Visit: Payer: Self-pay

## 2021-03-02 ENCOUNTER — Encounter: Payer: Self-pay | Admitting: Family Medicine

## 2021-03-02 ENCOUNTER — Ambulatory Visit (INDEPENDENT_AMBULATORY_CARE_PROVIDER_SITE_OTHER): Payer: 59 | Admitting: Family Medicine

## 2021-03-02 VITALS — BP 118/70 | HR 92 | Resp 12 | Ht 64.0 in | Wt 167.1 lb

## 2021-03-02 DIAGNOSIS — K5190 Ulcerative colitis, unspecified, without complications: Secondary | ICD-10-CM | POA: Diagnosis not present

## 2021-03-02 DIAGNOSIS — Z1211 Encounter for screening for malignant neoplasm of colon: Secondary | ICD-10-CM

## 2021-03-02 DIAGNOSIS — N644 Mastodynia: Secondary | ICD-10-CM | POA: Diagnosis not present

## 2021-03-02 DIAGNOSIS — N649 Disorder of breast, unspecified: Secondary | ICD-10-CM

## 2021-03-02 MED ORDER — CLOTRIMAZOLE-BETAMETHASONE 1-0.05 % EX CREA: 1.0000 "application " | TOPICAL_CREAM | Freq: Every day | CUTANEOUS | 0 refills | Status: DC

## 2021-03-02 NOTE — Progress Notes (Signed)
Chief Complaint  Patient presents with  . breast irritation   HPI: [name] is a 45 y.o. female, who is here today with above complaint. Pruritic and erythematous rash lateral aspect of left nipple, it was "spreading" and with yellow exudate noted since 3rd week in 01/2021.Marland Kitchen She was evaluated for similar problem on 02/13/21, topical nystatin recommended.  Medication helped but still with thick and scaly area on left nipple. She is not longer having drainage nor erythema. Completed treatment of Doxycycline for bronchitis recently, 02/15/21.  For a couple months she has had intermittent erythematous and tender lump under left nipple. She has not identified exacerbating or alleviating factors. Negative for fever,chills,abnormal wt loss,cough,wheezing,or SOB. LMP 02/14/21.  Mammogram in 03/2013 BI-RADS 2. FHx negative for breast cancer, PGM ovarian cancer.  Hyperprolactinemia: She is not longer following with endocrinologist. Last prolactin in normal limit, 29.6 (11.3). She has not noted nipple discharge or masses.  She had headache until 1-2 days ago, frontal and occipital,attributed to recent respiratory illness.  Hx of UC: Dx;ed in her 15's. She has not had symptoms since then.She tries to avoid ffods that can cause GI irritation. Last colonoscopy when she was fist Dx'ed.  Review of Systems  Constitutional: Negative for activity change, appetite change, fatigue and unexpected weight change.  HENT: Negative for mouth sores, sore throat and trouble swallowing.   Cardiovascular: Negative for palpitations.  Gastrointestinal: Negative for abdominal pain, nausea and vomiting.       No changes in bowel habits.  Musculoskeletal: Negative for gait problem and myalgias.  Neurological: Negative for weakness and numbness.  Rest see pertinent positives and negatives per HPI.  Current Outpatient Medications on File Prior to Visit  Medication Sig Dispense Refill  . aspirin 325  MG tablet Take 325 mg by mouth as needed for pain.    Marland Kitchen CALCIUM PO Take 400 mg by mouth. 2 capsules daily    . CRANBERRY PO Take by mouth. 2 a day    . Diclofenac Sodium (PENNSAID) 2 % SOLN Place 2 g onto the skin 2 (two) times daily. 112 g 3  . ibuprofen (ADVIL,MOTRIN) 200 MG tablet Take 200 mg by mouth every 6 (six) hours as needed.    . Lactobacillus (ACIDOPHILUS PO) Take by mouth. 2 a day    . Multiple Vitamin (MULTIVITAMIN) tablet Take 1 tablet by mouth daily.    . NON FORMULARY     . nystatin cream (MYCOSTATIN) Apply 1 application topically 2 (two) times daily. 30 g 0  . spironolactone (ALDACTONE) 50 MG tablet Take by mouth.    . Vitamin D, Ergocalciferol, (DRISDOL) 1.25 MG (50000 UNIT) CAPS capsule TAKE 1 CAPSULE BY MOUTH EVERY 7 DAYS 12 capsule 0   No current facility-administered medications on file prior to visit.     Past Medical History:  Diagnosis Date  . Colitis, ulcerative chronic (HCC)    Dr Terance Hart  . Cystitis, interstitial   . Endometriosis   . Goiter   . Hyperprolactinemia ([place])   . Migraine aura without headache   . Polycystic ovarian syndrome    Allergies  Allergen Reactions  . Codeine Other (See Comments)    Other reaction(s): Dizziness (intolerance)    Social History   Socioeconomic History  . Marital status: Single    Spouse name: Not on file  . Number of children: Not on file  . Years of education: Not on file  . Highest education level: Not on file  Occupational History  .  Not on file  Tobacco Use  . Smoking status: Never Smoker  . Smokeless tobacco: Never Used  Vaping Use  . Vaping Use: Never used  Substance and Sexual Activity  . Alcohol use: Yes    Alcohol/week: 0.0 standard drinks    Comment: socially 1-2 a month  . Drug use: No  . Sexual activity: Yes    Partners: Male    Birth control/protection: Pill  Other Topics Concern  . Not on file  Social History Narrative  . Not on file   Social Determinants of Health   Financial  Resource Strain: Not on file  Food Insecurity: Not on file  Transportation Needs: Not on file  Physical Activity: Not on file  Stress: Not on file  Social Connections: Not on file    Vitals:   03/02/21 1427  BP: 118/70  Pulse: 92  Resp: 12  SpO2: 99%   Body mass index is 28.69 kg/m.  Physical Exam Vitals and nursing note reviewed. Exam conducted with a chaperone present.  Constitutional:      General: She is not in acute distress.    Appearance: She is well-developed.  HENT:     Head: Normocephalic and atraumatic.  Eyes:     Conjunctiva/sclera: Conjunctivae normal.  Cardiovascular:     Rate and Rhythm: Normal rate and regular rhythm.  Pulmonary:     Effort: Pulmonary effort is normal. No respiratory distress.     Breath sounds: Normal breath sounds.  Chest:  Breasts:     Right: No axillary adenopathy or supraclavicular adenopathy.     Left: No axillary adenopathy or supraclavicular adenopathy.        Comments: Breast: Dense breast, outer upper quadrant mainly, bilateral. Left breast with nipple thickness and scaly skin lateral. Lymphadenopathy:     Cervical: No cervical adenopathy.     Upper Body:     Right upper body: No supraclavicular or axillary adenopathy.     Left upper body: No supraclavicular or axillary adenopathy.  Skin:    General: Skin is warm.     Findings: No erythema.  Neurological:     General: No focal deficit present.     Mental Status: She is alert and oriented to person, place, and time.  Psychiatric:     Comments: Well groomed, good eye contact.   ASSESSMENT AND PLAN:  Mindy Lowe was seen today for breast irritation.  Diagnoses and all orders for this visit:  Orders Placed This Encounter  Procedures  . MM DIAG BREAST TOMO BILATERAL  . Ambulatory referral to Gastroenterology   Lesion of left nipple We discussed possible etiologies. Paget disease is in the differential Dx. Lesion has improved. Recommend Lotrisone small amount  bid for 14 days. Dx mammogram will be arranged.  -     clotrimazole-betamethasone (LOTRISONE) cream; Apply 1 application topically daily.  Breast pain in female Intermittent tender lesion, ?abscess. Dx mammogram will be arranged.  Ulcerative colitis without complications, unspecified location ([place]) Problem seems to be in remission.  Colon cancer screening -     Ambulatory referral to Gastroenterology   Return if symptoms worsen or fail to improve.   Deryl Ports G. Martinique, MD  [facility] [place]. [place] office.   A few things to remember from today's visit:   Lesion of left nipple - Plan: MM DIAG BREAST TOMO BILATERAL, clotrimazole-betamethasone (LOTRISONE) cream  Breast pain in female - Plan: MM DIAG BREAST TOMO BILATERAL  Ulcerative colitis without complications, unspecified location [facility]), Chronic  Colon  cancer screening - Plan: Ambulatory referral to Gastroenterology  If you need refills please call your pharmacy. Do not use My Chart to request refills or for acute issues that need immediate attention.   Lotrisone para aplicar en la zona del pezon 2 veces al dia por 2 semanas.  Please be sure medication list is accurate. If a new problem present, please set up appointment sooner than planned today.

## 2021-03-02 NOTE — Patient Instructions (Signed)
A few things to remember from today's visit:   Lesion of left nipple - Plan: MM DIAG BREAST TOMO BILATERAL, clotrimazole-betamethasone (LOTRISONE) cream  Breast pain in female - Plan: MM DIAG BREAST TOMO BILATERAL  Ulcerative colitis without complications, unspecified location [facility]), Chronic  Colon cancer screening - Plan: Ambulatory referral to Gastroenterology  If you need refills please call your pharmacy. Do not use My Chart to request refills or for acute issues that need immediate attention.   Lotrisone para aplicar en la zona del pezon 2 veces al dia por 2 semanas.  Please be sure medication list is accurate. If a new problem present, please set up appointment sooner than planned today.

## 2021-03-08 ENCOUNTER — Telehealth: Payer: Self-pay | Admitting: Family Medicine

## 2021-03-08 NOTE — Telephone Encounter (Signed)
Patient is calling and stated that provider was supposed to put in a referral to get a mammogram but she haven't heard anything back yet, please advise. CB is [phone]

## 2021-03-18 ENCOUNTER — Other Ambulatory Visit: Payer: Self-pay | Admitting: Family Medicine

## 2021-03-18 DIAGNOSIS — N63 Unspecified lump in unspecified breast: Secondary | ICD-10-CM

## 2021-05-04 ENCOUNTER — Other Ambulatory Visit: Payer: Self-pay

## 2021-05-04 ENCOUNTER — Other Ambulatory Visit: Payer: Self-pay | Admitting: Family Medicine

## 2021-05-04 ENCOUNTER — Ambulatory Visit
Admission: RE | Admit: 2021-05-04 | Discharge: 2021-05-04 | Disposition: A | Discharge status: Home / Self Care | Payer: 59 | Source: Ambulatory Visit | Attending: Family Medicine | Admitting: Family Medicine

## 2021-05-04 DIAGNOSIS — N63 Unspecified lump in unspecified breast: Secondary | ICD-10-CM

## 2021-07-22 IMAGING — CR DG CHEST 2V
2 series · 2 of 2 positions shown · non-contrast
Comparison: 09/15/2020

CLINICAL DATA: Right-sided chest pain.

EXAM:
CHEST - 2 VIEW

[w chest pa]
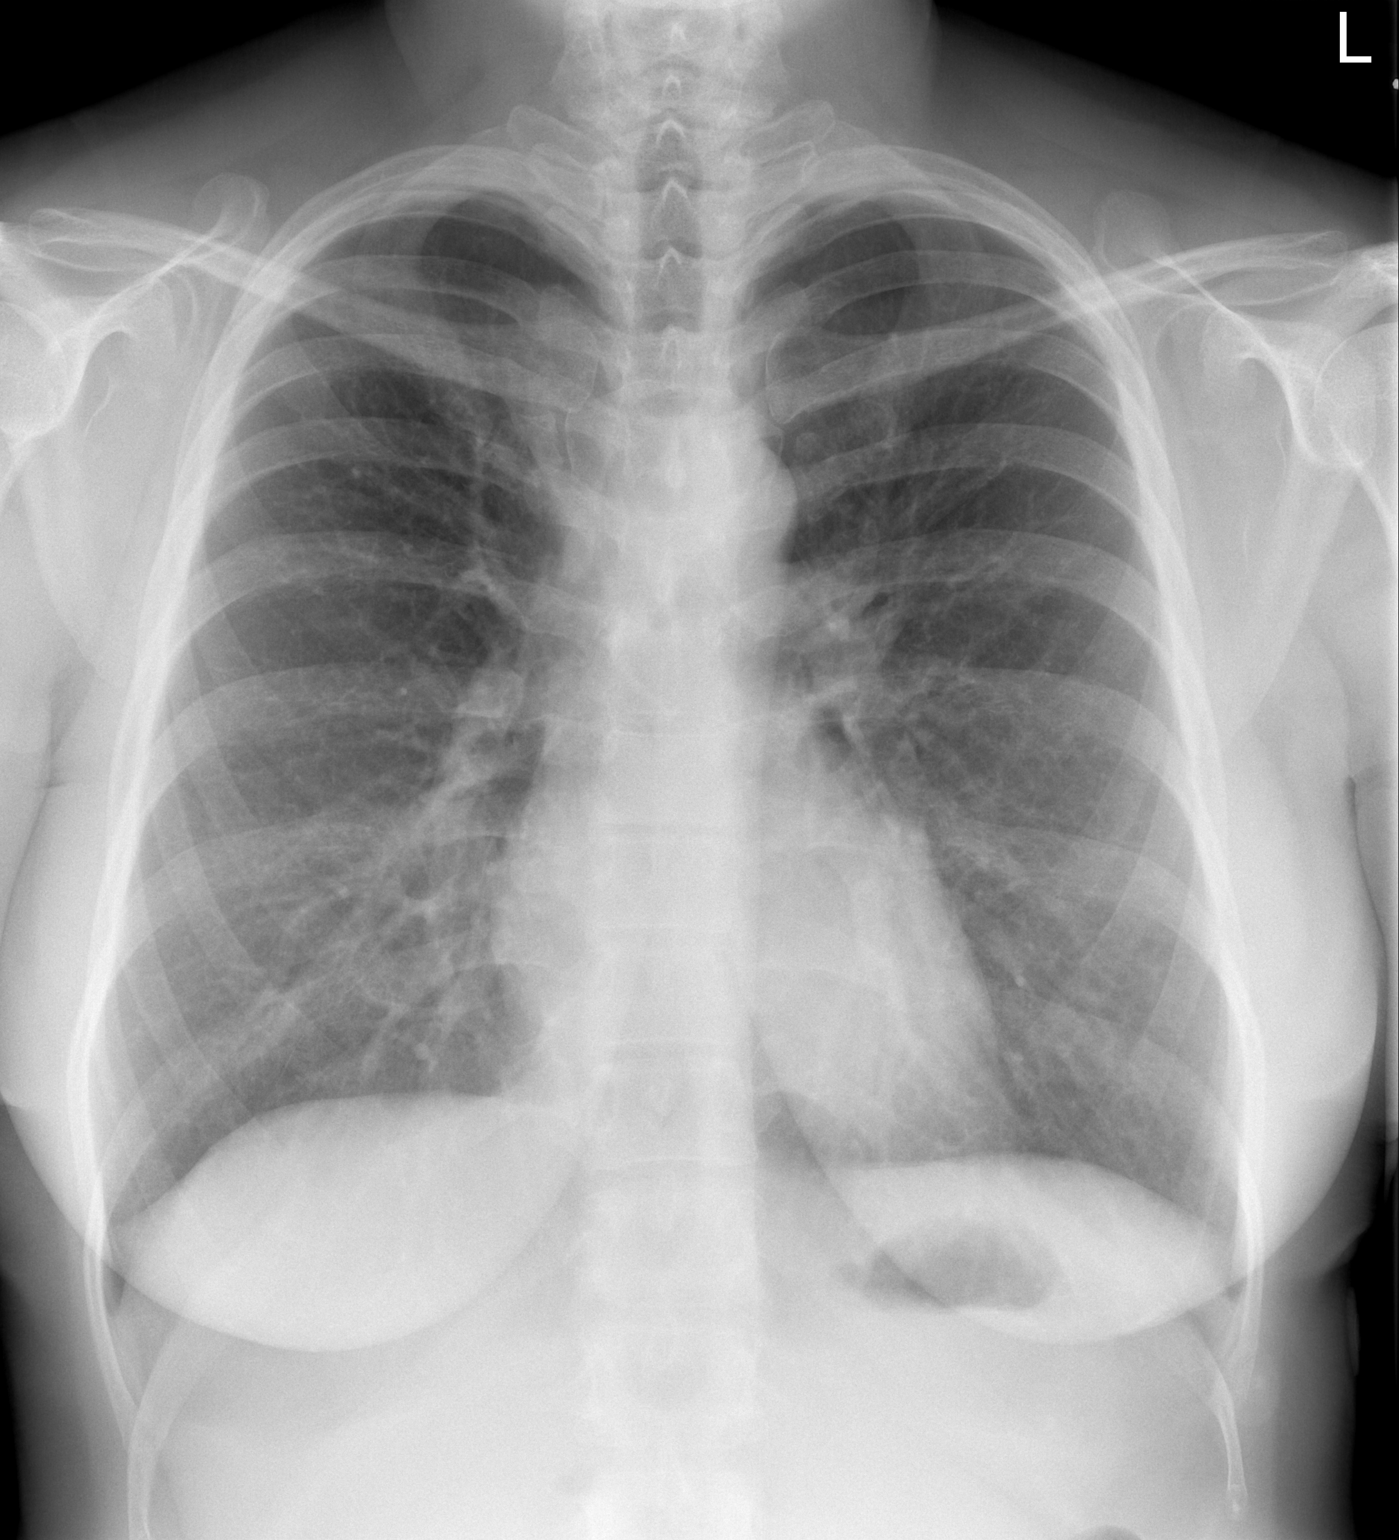

[w chest lat]
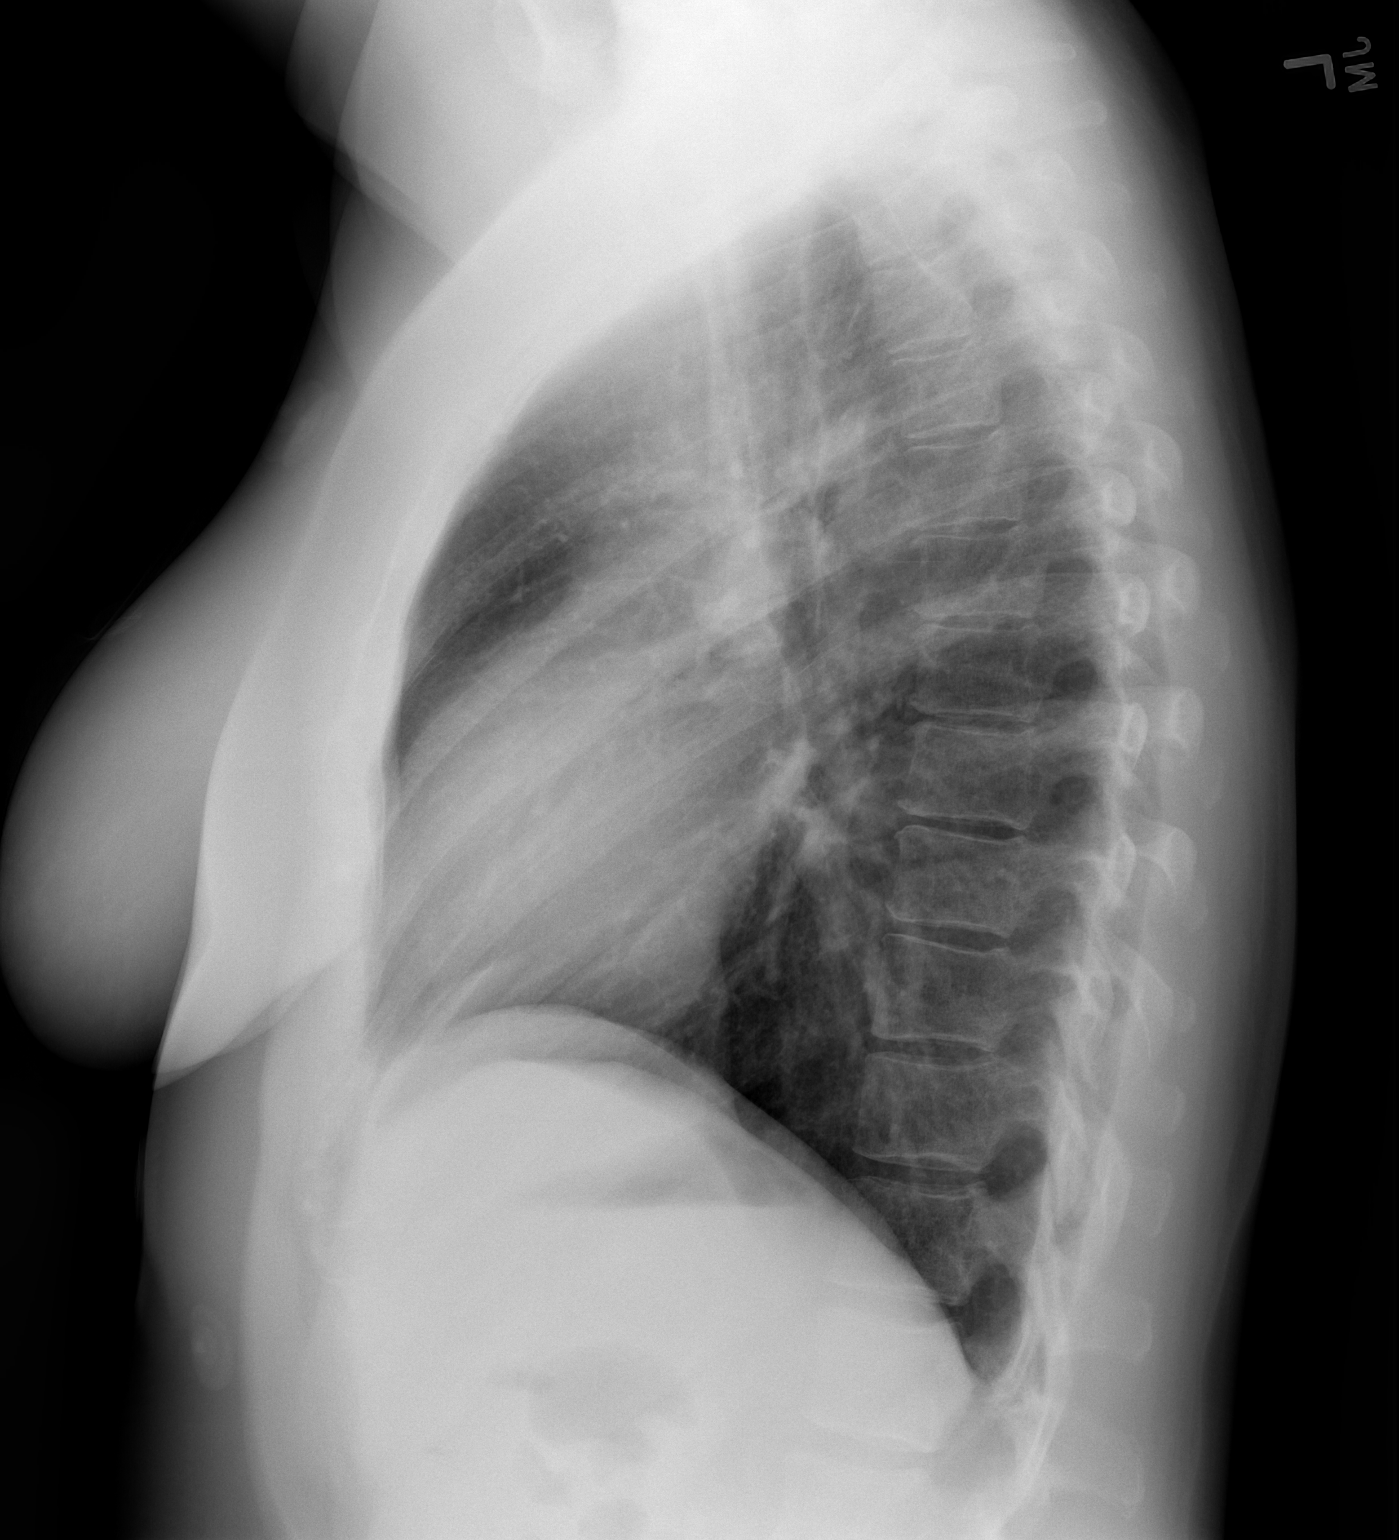

[2 of 2 positions shown; findings below may reference images not displayed]

FINDINGS: The cardiomediastinal contours are normal. The lungs are clear.
Pulmonary vasculature is normal. No consolidation, pleural effusion,
or pneumothorax. No acute osseous abnormalities are seen.
IMPRESSION: Negative radiographs of the chest.

## 2021-08-05 ENCOUNTER — Other Ambulatory Visit: Payer: 59

## 2021-08-13 ENCOUNTER — Encounter: Payer: 59 | Admitting: Family Medicine

## 2021-09-02 ENCOUNTER — Ambulatory Visit
Admission: RE | Admit: 2021-09-02 | Discharge: 2021-09-02 | Disposition: A | Discharge status: Home / Self Care | Payer: 59 | Source: Ambulatory Visit | Attending: Family Medicine | Admitting: Family Medicine

## 2021-09-02 DIAGNOSIS — N63 Unspecified lump in unspecified breast: Secondary | ICD-10-CM

## 2021-09-13 NOTE — Progress Notes (Signed)
HPI: Mindy Lowe is a 45 y.o. female, who is here today for her routine physical.  Last CPE: 08/11/20  Regular exercise 3 or more time per week: She goes to the gym 4 times per week: [employer],walking,gym machines. Following a healthy diet: She cooks at home, eats 2 times per day.She tries to ear vegetables and salads a few times per day. Beans, fish.   She lives with her mother.   Chronic medical problems: HLD,chronic back pain,migraine, vit D deficiency,and goiter.  Sees GYN - overdue for pap.  She last saw gyn in 03/2018.  Immunization History  Administered Date(s) Administered   Influenza,inj,Quad PF,6+ Mos 08/11/2020, 09/14/2021   Td 11/16/2005   Tdap 10/03/2009, 08/11/2020   Health Maintenance  Topic Date Due   PAP SMEAR-Modifier  05/03/2018   COLONOSCOPY (Pts 45-47yrs Insurance coverage will need to be confirmed)  Never done   TETANUS/TDAP  08/11/2030   INFLUENZA VACCINE  Completed   Hepatitis C Screening  Completed   HIV Screening  Completed   Pneumococcal Vaccine 69-49 Years old  Aged Out   HPV VACCINES  Aged Out   Pap smear:3+ years.05/04/2015.  Negative for Intraepithelial Lesions or Malignancy.      HPV not detected.   ADEQUACY OF SPECIMEN:            Satisfactory for evaluation. Endocervical cells/transformation zone component identified.  She is established with gyn.  HLD:She is not on pharmacologic treatment.  Lab Results  Component Value Date   CHOL 207 (H) 08/26/2020   HDL 55 08/26/2020   LDLCALC 127 (H) 08/26/2020   LDLDIRECT 137.3 02/17/2009   TRIG 142 08/26/2020   CHOLHDL 3.8 08/26/2020   Vit d def: She is not on vit D supplementation.  Pruritic and sometimes tender rash on breast, bilateral. Lotrisone cream was initially helping. She has not identified exacerbating or alleviating factors. 03/02/21 she had skin lesion on left breast, she is having similar one intermittent. Dx mammogram in 05/2021:  1. Indeterminate discoloration of  the left nipple without suspicious findings on mammogram. 2. Palpable abnormality left breast most compatible with resolving sebaceous cyst or cutaneous inflammatory/infectious process. 3. Probably benign left breast mass 2 o'clock position, likely representing a reactive intramammary lymph node.  Evaluated by surgeon 07/02/21.  Review of Systems  Constitutional:  Negative for appetite change, fatigue and fever.  HENT:  Positive for rhinorrhea and sore throat (Occasional "scratchy" throat.). Negative for hearing loss, mouth sores, trouble swallowing and voice change.   Eyes:  Negative for redness and visual disturbance.  Respiratory:  Negative for cough, shortness of breath and wheezing.   Cardiovascular:  Negative for chest pain and leg swelling.  Gastrointestinal:  Negative for abdominal pain, nausea and vomiting.       No changes in bowel habits.  Endocrine: Negative for cold intolerance, heat intolerance, polydipsia, polyphagia and polyuria.  Genitourinary:  Negative for decreased urine volume, dysuria, hematuria, vaginal bleeding and vaginal discharge.  Musculoskeletal:  Negative for gait problem and myalgias.  Skin:  Positive for rash.  Allergic/Immunologic: Positive for environmental allergies.  Neurological:  Negative for syncope, weakness and headaches.  Hematological:  Negative for adenopathy. Does not bruise/bleed easily.  Psychiatric/Behavioral:  Negative for confusion and sleep disturbance. The patient is not nervous/anxious.   All other systems reviewed and are negative.  Current Outpatient Medications on File Prior to Visit  Medication Sig Dispense Refill   aspirin 325 MG tablet Take 325 mg by mouth as needed for  pain.     CALCIUM PO Take 400 mg by mouth. 2 capsules daily     clotrimazole-betamethasone (LOTRISONE) cream Apply 1 application topically daily. 30 g 0   Diclofenac Sodium (PENNSAID) 2 % SOLN Place 2 g onto the skin 2 (two) times daily. 112 g 3   ibuprofen  (ADVIL,MOTRIN) 200 MG tablet Take 200 mg by mouth every 6 (six) hours as needed.     Lactobacillus (ACIDOPHILUS PO) Take by mouth. 2 a day     Multiple Vitamin (MULTIVITAMIN) tablet Take 1 tablet by mouth daily.     NON FORMULARY      nystatin cream (MYCOSTATIN) Apply 1 application topically 2 (two) times daily. 30 g 0   No current facility-administered medications on file prior to visit.   Past Medical History:  Diagnosis Date   Colitis, ulcerative chronic (HCC)    Dr Terance Hart   Cystitis, interstitial    Endometriosis    Goiter    Hyperprolactinemia (HCC)    Migraine aura without headache    Polycystic ovarian syndrome     Past Surgical History:  Procedure Laterality Date   APPENDECTOMY     DIAGNOSTIC LAPAROSCOPY      Allergies  Allergen Reactions   Codeine Other (See Comments)    Other reaction(s): Dizziness (intolerance)    Family History  Problem Relation Age of Onset   Lung cancer Maternal Grandfather    Cancer Maternal Grandfather    Ovarian cancer Paternal Grandmother        then developed pancreatic   Cancer Paternal Grandmother    Diabetes Paternal Grandmother    Hyperlipidemia Mother    Hypertension Mother    Arthritis Mother    Diabetes Father    Early death Father    Heart attack Father    Early death Maternal Grandmother     Social History   Socioeconomic History   Marital status: Single    Spouse name: Not on file   Number of children: Not on file   Years of education: Not on file   Highest education level: Not on file  Occupational History   Not on file  Tobacco Use   Smoking status: Never   Smokeless tobacco: Never  Vaping Use   Vaping Use: Never used  Substance and Sexual Activity   Alcohol use: Yes    Alcohol/week: 0.0 standard drinks    Comment: socially 1-2 a month   Drug use: No   Sexual activity: Yes    Partners: Male    Birth control/protection: Pill  Other Topics Concern   Not on file  Social History Narrative   Not on  file   Social Determinants of Health   Financial Resource Strain: Not on file  Food Insecurity: Not on file  Transportation Needs: Not on file  Physical Activity: Not on file  Stress: Not on file  Social Connections: Not on file   Vitals:   09/14/21 0802  BP: 118/76  Pulse: 92  Resp: 16  Temp: 98.3 F (36.8 C)  SpO2: 99%   Body mass index is 28.71 kg/m.  Wt Readings from Last 3 Encounters:  09/14/21 167 lb 4 oz (75.9 kg)  03/02/21 167 lb 2 oz (75.8 kg)  02/22/21 160 lb (72.6 kg)   Physical Exam Vitals and nursing note reviewed.  Constitutional:      General: She is not in acute distress.    Appearance: She is well-developed.  HENT:     Head: Normocephalic and  atraumatic.     Right Ear: Hearing, tympanic membrane, ear canal and external ear normal.     Left Ear: Hearing, tympanic membrane, ear canal and external ear normal.     Mouth/Throat:     Mouth: Mucous membranes are moist.     Pharynx: Oropharynx is clear. Uvula midline.  Eyes:     Extraocular Movements: Extraocular movements intact.     Conjunctiva/sclera: Conjunctivae normal.     Pupils: Pupils are equal, round, and reactive to light.  Neck:     Thyroid: No thyroid mass or thyromegaly (palpable.).     Trachea: No tracheal deviation.  Cardiovascular:     Rate and Rhythm: Normal rate and regular rhythm.     Pulses:          Dorsalis pedis pulses are 2+ on the right side and 2+ on the left side.     Heart sounds: No murmur heard. Pulmonary:     Effort: Pulmonary effort is normal. No respiratory distress.     Breath sounds: Normal breath sounds.  Abdominal:     Palpations: Abdomen is soft. There is no hepatomegaly or mass.     Tenderness: There is no abdominal tenderness.  Genitourinary:    Comments: Deferred to gyn. Musculoskeletal:     Comments: No major deformity or signs of synovitis appreciated.  Lymphadenopathy:     Cervical: No cervical adenopathy.     Upper Body:     Right upper body: No  supraclavicular adenopathy.     Left upper body: No supraclavicular adenopathy.  Skin:    General: Skin is warm.     Findings: Lesion present.     Comments: Erythematous not tender lesions scattered on right breat. No induration.No local heat.  Neurological:     General: No focal deficit present.     Mental Status: She is alert and oriented to person, place, and time.     Cranial Nerves: No cranial nerve deficit.     Coordination: Coordination normal.     Gait: Gait normal.     Deep Tendon Reflexes:     Reflex Scores:      Bicep reflexes are 2+ on the right side and 2+ on the left side.      Patellar reflexes are 2+ on the right side and 2+ on the left side. Psychiatric:        Speech: Speech normal.     Comments: Well groomed, good eye contact.   ASSESSMENT AND PLAN:  Mindy Lowe was here today annual physical examination.  Orders Placed This Encounter  Procedures   Flu Vaccine QUAD 56mo+IM (Fluarix, Fluzone & Alfiuria Quad PF)   VITAMIN D 25 Hydroxy (Vit-D Deficiency, Fractures)   Lipid panel   Hemoglobin E9B   Basic metabolic panel   LDL cholesterol, direct   Ambulatory referral to Dermatology   Ambulatory referral to Gynecology   Lab Results  Component Value Date   CREATININE 0.81 09/14/2021   BUN 15 09/14/2021   NA 138 09/14/2021   K 3.7 09/14/2021   CL 102 09/14/2021   CO2 29 09/14/2021   Lab Results  Component Value Date   CHOL 228 (H) 09/14/2021   HDL 59.70 09/14/2021   LDLCALC 127 (H) 08/26/2020   LDLDIRECT 147.0 09/14/2021   TRIG 212.0 (H) 09/14/2021   CHOLHDL 4 09/14/2021   Lab Results  Component Value Date   HGBA1C 5.7 09/14/2021   Routine general medical examination at a health care facility We  discussed the importance of regular physical activity and healthy diet for prevention of chronic illness and/or complications. Preventive guidelines reviewed. Continue female preventive care with gyn. Vaccination up to date.  Next CPE in a  year. The 10-year ASCVD risk score (Arnett DK, et al., 2019) is: 0.8%   Values used to calculate the score:     Age: 64 years     Sex: Female     Is Non-Hispanic African American: No     Diabetic: No     Tobacco smoker: No     Systolic Blood Pressure: 423 mmHg     Is BP treated: No     HDL Cholesterol: 59.7 mg/dL     Total Cholesterol: 228 mg/dL  Screening for endocrine, metabolic and immunity disorder -     Basic metabolic panel -     Hemoglobin A1c  Hyperlipidemia, unspecified hyperlipidemia type Non pharmacologic treatment recommended for now. Further recommendations will be given according to 10 years CVD risk score and lipid panel numbers.  Vitamin D deficiency, unspecified She is not on Vit D supplementation. Further recommendations according to 25 [place] vit D result.  Skin lesion of breast ? Furunculosis. Dermatology referral placed.  Need for influenza vaccination -     Flu Vaccine QUAD 63mo+IM (Fluarix, Fluzone & Alfiuria Quad PF)  Return in 1 year (on 09/14/2022) for CPE.  Araeya Lamb G. Martinique, MD  [facility]. [place] office.

## 2021-09-14 ENCOUNTER — Encounter: Payer: Self-pay | Admitting: Family Medicine

## 2021-09-14 ENCOUNTER — Ambulatory Visit (INDEPENDENT_AMBULATORY_CARE_PROVIDER_SITE_OTHER): Payer: 59 | Admitting: Family Medicine

## 2021-09-14 VITALS — BP 118/76 | HR 92 | Temp 98.3°F | Resp 16 | Ht 64.0 in | Wt 167.2 lb

## 2021-09-14 DIAGNOSIS — Z0000 Encounter for general adult medical examination without abnormal findings: Secondary | ICD-10-CM

## 2021-09-14 DIAGNOSIS — E785 Hyperlipidemia, unspecified: Secondary | ICD-10-CM | POA: Diagnosis not present

## 2021-09-14 DIAGNOSIS — E559 Vitamin D deficiency, unspecified: Secondary | ICD-10-CM

## 2021-09-14 DIAGNOSIS — Z23 Encounter for immunization: Secondary | ICD-10-CM | POA: Diagnosis not present

## 2021-09-14 DIAGNOSIS — Z1329 Encounter for screening for other suspected endocrine disorder: Secondary | ICD-10-CM | POA: Diagnosis not present

## 2021-09-14 DIAGNOSIS — Z130 Encounter for screening for diseases of the blood and blood-forming organs and certain disorders involving the immune mechanism: Secondary | ICD-10-CM | POA: Diagnosis not present

## 2021-09-14 DIAGNOSIS — L988 Other specified disorders of the skin and subcutaneous tissue: Secondary | ICD-10-CM

## 2021-09-14 DIAGNOSIS — Z13228 Encounter for screening for other metabolic disorders: Secondary | ICD-10-CM | POA: Diagnosis not present

## 2021-09-14 LAB — LIPID PANEL
Cholesterol: 228 mg/dL — ABNORMAL HIGH (ref 0–200)
HDL: 59.7 mg/dL (ref >39.00)
NonHDL: 168.08
Total CHOL/HDL Ratio: 4
Triglycerides: 212 mg/dL — ABNORMAL HIGH (ref 0.0–149.0)
VLDL: 42.4 mg/dL — ABNORMAL HIGH (ref 0.0–40.0)

## 2021-09-14 LAB — BASIC METABOLIC PANEL
BUN: 15 mg/dL (ref 6–23)
CO2: 29 mEq/L (ref 19–32)
Calcium: 9.3 mg/dL (ref 8.4–10.5)
Chloride: 102 mEq/L (ref 96–112)
Creatinine, Ser: 0.81 mg/dL (ref 0.40–1.20)
GFR: 87.28 mL/min (ref >60.00)
Glucose, Bld: 82 mg/dL (ref 70–99)
Potassium: 3.7 mEq/L (ref 3.5–5.1)
Sodium: 138 mEq/L (ref 135–145)

## 2021-09-14 LAB — LDL CHOLESTEROL, DIRECT: Direct LDL: 147 mg/dL

## 2021-09-14 LAB — VITAMIN D 25 HYDROXY (VIT D DEFICIENCY, FRACTURES): VITD: 42.81 ng/mL (ref 30.00–100.00)

## 2021-09-14 LAB — HEMOGLOBIN A1C: Hgb A1c MFr Bld: 5.7 % (ref 4.6–6.5)

## 2021-09-14 NOTE — Patient Instructions (Addendum)
A few things to remember from today's visit:  Routine general medical examination at a health care facility - Plan: Ambulatory referral to Gynecology  Screening for endocrine, metabolic and immunity disorder - Plan: Hemoglobin Z5G, Basic metabolic panel  Hyperlipidemia, unspecified hyperlipidemia type - Plan: Lipid panel  Vitamin D deficiency, unspecified - Plan: VITAMIN D 25 Hydroxy (Vit-D Deficiency, Fractures)  Skin lesion of breast - Plan: Ambulatory referral to Dermatology  Do not use My Chart to request refills or for acute issues that need immediate attention. Usted necesita su citologia vajinal.   Please be sure medication list is accurate. If a new problem present, please set up appointment sooner than planned today.  Mantenimiento de Technical sales engineer en [place] Maintenance, Female Adoptar un estilo de vida saludable y recibir atencin preventiva son importantes para promover la salud y Musician. Consulte al mdico sobre: El esquema adecuado para hacerse pruebas y exmenes peridicos. Cosas que puede hacer por su cuenta para prevenir enfermedades y [place] sano. Qu debo saber sobre la dieta, el peso y el ejercicio? Consuma una dieta saludable  Consuma una dieta que incluya muchas verduras, frutas, productos lcteos con bajo contenido de [place] y Advertising account planner. No consuma muchos alimentos ricos en grasas slidas, azcares agregados o sodio. Mantenga un peso saludable El ndice de masa muscular [facility]) se [place] the [place] para identificar problemas de [place]. Proporciona una estimacin de la grasa corporal basndose en el peso y la altura. Su mdico puede ayudarle a Radiation protection practitioner [place] y a Scientist, forensic o Theatre manager un peso saludable. Haga ejercicio con regularidad Haga ejercicio con regularidad. Esta es una de las prcticas ms importantes que puede hacer por su salud. La [employer] de los adultos deben seguir estas pautas: Optometrist, al menos, 150 minutos de actividad fsica por semana. El  ejercicio debe aumentar la frecuencia cardaca y Nature conservation officer transpirar (ejercicio de intensidad moderada). Hacer ejercicios de fortalecimiento por lo [employer] por semana. Agregue esto a su plan de ejercicio de intensidad moderada. Pase menos tiempo sentada. Incluso la actividad fsica ligera puede ser beneficiosa. Controle sus niveles de colesterol y lpidos en la sangre Comience a realizarse anlisis de lpidos y Research officer, trade union en la sangre a los 33 aos y luego reptalos cada 5 aos. Hgase controlar los niveles de colesterol con mayor frecuencia si: Sus niveles de lpidos y colesterol son altos. Es mayor de 67 aos. Presenta un alto riesgo de padecer enfermedades cardacas. Qu debo saber sobre las pruebas de deteccin del cncer? Segn su historia clnica y sus antecedentes familiares, es posible que deba realizarse pruebas de deteccin del cncer en diferentes edades. Esto puede incluir pruebas de deteccin de lo siguiente: Cncer de mama. Cncer de cuello uterino. Cncer colorrectal. Cncer de piel. Cncer de pulmn. Qu debo saber sobre la enfermedad cardaca, la diabetes y la hipertensin arterial? Presin arterial y enfermedad cardaca La hipertensin arterial causa enfermedades cardacas y [place] el riesgo de accidente cerebrovascular. Es ms probable que esto se manifieste en las personas que tienen lecturas de presin arterial alta o tienen sobrepeso. Hgase controlar la presin arterial: Cada 3 a 5 aos si tiene entre 18 y 5 aos. Todos los aos si es mayor de 40 aos. Diabetes Realcese exmenes de deteccin de la diabetes con regularidad. Este anlisis revisa el nivel de azcar en la sangre en [place]. Hgase las pruebas de deteccin: Cada tres aos despus de los 64 aos de edad si tiene un peso normal y un bajo riesgo de padecer diabetes. Con ms  frecuencia y a partir de [place] edad inferior si tiene sobrepeso o un alto riesgo de padecer diabetes. Qu debo saber sobre la  prevencin de infecciones? Hepatitis B Si tiene un riesgo ms alto de contraer hepatitis B, debe someterse a un examen de deteccin de este virus. Hable con el mdico para averiguar si tiene riesgo de contraer la infeccin por hepatitis B. Hepatitis C Se recomienda el anlisis a: [employer] 1945 y 1965. Todas las personas que tengan un riesgo de haber contrado hepatitis C. Enfermedades de transmisin sexual (ETS) Hgase las pruebas de Programme researcher, broadcasting/film/video de ITS, incluidas la gonorrea y la clamidia, si: Es sexualmente activa y es menor de 9 aos. Es mayor de 14 aos, y Investment banker, operational informa que corre riesgo de tener este tipo de infecciones. La actividad sexual ha cambiado desde que le hicieron la ltima prueba de deteccin y tiene un riesgo mayor de Best boy clamidia o Radio broadcast assistant. Pregntele al mdico si usted tiene riesgo. Pregntele al mdico si usted tiene un alto riesgo de Museum/gallery curator VIH. El mdico tambin puede recomendarle un medicamento recetado para ayudar a evitar la infeccin por el VIH. Si elige tomar medicamentos para prevenir el VIH, primero debe [employer] de deteccin del VIH. Luego debe hacerse anlisis cada 3 meses mientras est tomando los medicamentos. Embarazo Si est por dejar de Librarian, academic (fase premenopusica) y usted puede quedar [place], busque asesoramiento antes de [place]. Tome de 400 a 800 microgramos (mcg) de cido [employer] si [place]. Pida mtodos de control de la natalidad (anticonceptivos) si desea evitar un embarazo no deseado. Osteoporosis y [place] La osteoporosis es una enfermedad en la que los huesos pierden los minerales y la fuerza por el avance de la edad. El resultado pueden ser fracturas en los [place]. Si tiene 59 aos o ms, o si est en riesgo de sufrir osteoporosis y fracturas, pregunte a su mdico si debe: Hacerse pruebas de deteccin de prdida sea. Tomar un suplemento de calcio o de vitamina D para  reducir el riesgo de fracturas. Recibir terapia de reemplazo hormonal (TRH) para tratar los sntomas de la menopausia. Siga estas indicaciones en su casa: Consumo de alcohol No beba alcohol si: Su mdico le indica no hacerlo. Est embarazada, puede estar embarazada o est tratando de [place]. Si bebe alcohol: Limite la cantidad que bebe a lo siguiente: De 0 a 1 bebida por da. Sepa cunta cantidad de alcohol hay en las bebidas que toma. En los [place], una medida equivale a una botella de cerveza de 12 oz (355 ml), un vaso de vino de 5 oz (148 ml) o un vaso de una bebida alcohlica de alta graduacin de 1 oz (44 ml). Estilo de vida No consuma ningn producto que contenga nicotina o tabaco. Estos productos incluyen cigarrillos, tabaco para Higher education careers adviser y aparatos de vapeo, como los Psychologist, sport and exercise. Si necesita ayuda para dejar de consumir estos productos, consulte al mdico. No consuma drogas. No comparta agujas. Solicite ayuda a su mdico si necesita apoyo o informacin para abandonar las drogas. Indicaciones generales Realcese los estudios de rutina de la salud, dentales y de Public librarian. [place]. Infrmele a su mdico si: Se siente deprimida con frecuencia. Alguna vez ha sido vctima de [place] o no se siente seguro en su casa. Resumen Adoptar un estilo de vida saludable y recibir atencin preventiva son importantes para promover la salud y Musician. [place]  acerca de una dieta saludable, el ejercicio y la realizacin de pruebas o exmenes para Engineer, building services. Siga las instrucciones del mdico con respecto al control del colesterol y la presin arterial. Esta informacin no tiene Marine scientist el consejo del mdico. Asegrese de hacerle al mdico cualquier pregunta que tenga. Document Revised: 02/25/2021 Document Reviewed: 02/25/2021 Elsevier Patient Education  [place].

## 2021-09-17 ENCOUNTER — Other Ambulatory Visit: Payer: Self-pay

## 2021-09-17 MED ORDER — LOVASTATIN 20 MG PO TABS: 20.0000 mg | ORAL_TABLET | Freq: Every day | ORAL | 3 refills | Status: DC

## 2021-11-10 ENCOUNTER — Encounter: Payer: 59 | Admitting: Family Medicine

## 2022-01-26 ENCOUNTER — Encounter: Payer: 59 | Admitting: Family Medicine

## 2022-06-20 NOTE — Progress Notes (Unsigned)
ACUTE VISIT Chief Complaint  Patient presents with   pressure in back of head   HPI: Mindy Lowe is a 46 y.o. female with hx of HLD,UC, and chronic back pain here today complaining of pressure like headache 3 days ago. Headache has resolved, lasted 2 days. She has hx of migraines headache, has not had an episode in years, triggered by OCP's. Episode was proceeded by nausea and vomiting for 2 hours. She also had diarrhea. She is not sure if she ate something that caused these symptoms.  Headache  This is a new problem. The current episode started in the past 7 days. The problem has been resolved. The pain is located in the Parietal, occipital and bilateral region. The pain does not radiate. The pain quality is similar to prior headaches. The quality of the pain is described as dull and throbbing. The pain is at a severity of 8/10. The pain is moderate. Associated symptoms include abdominal pain (Mild lower abdominal cramps, alleviated by defecation.Resolved.), nausea, neck pain, phonophobia, scalp tenderness and vomiting. Pertinent negatives include no back pain, blurred vision, coughing, dizziness, drainage, ear pain, eye pain, eye redness, facial sweating, fever, hearing loss, loss of balance, muscle aches, photophobia, rhinorrhea, seizures, sinus pressure, sore throat, swollen glands, tingling, tinnitus, visual change, weakness or weight loss. Nothing aggravates the symptoms. She has tried NSAIDs for the symptoms. The treatment provided moderate relief. Her past medical history is significant for migraine headaches. There is no history of cancer or hypertension.  Took Advil 200 mg 2 x d for 2 days and Aspirin x2 for 2 days. Started with nausea again yesterday but now alleviated by eating something. Diarrhea exacerbated by food intake, last time yesterday after lunch.  Brain MRI 10/16: 1. 7 x 5 x 3 mm lesion along the inferior right aspect of the pituitary gland compatible with a  pituitary microadenoma. 2. Minimal T2 changes adjacent to the ventricles bilaterally. This may be within normal limits for age. This could be related to complex migraine headaches.  3. No acute intracranial abnormality.  Also c/o fatigue for about a month. Had URI 3 weeks ago No changes in sleep, about 6-7 hours. Drinking energy drinks. Denies depression like symptoms.  LMP: Irregular, q 2 months, heavy x 2 days. No sexually active. No nipple discharge. Follows with gyn.  Review of Systems  Constitutional:  Positive for fatigue. Negative for appetite change, chills, fever and weight loss.  HENT:  Negative for ear pain, hearing loss, rhinorrhea, sinus pressure, sore throat and tinnitus.   Eyes:  Negative for blurred vision, photophobia, pain and redness.  Respiratory:  Negative for cough, shortness of breath and wheezing.   Cardiovascular:  Negative for chest pain and palpitations.  Gastrointestinal:  Positive for abdominal pain (Mild lower abdominal cramps, alleviated by defecation.Resolved.), nausea and vomiting.  Endocrine: Negative for cold intolerance and heat intolerance.  Genitourinary:  Negative for decreased urine volume, dysuria and hematuria.  Musculoskeletal:  Positive for neck pain. Negative for back pain and gait problem.  Neurological:  Positive for headaches. Negative for dizziness, tingling, seizures, weakness and loss of balance.  Psychiatric/Behavioral:  Negative for confusion and hallucinations. The patient is not nervous/anxious.   Rest see pertinent positives and negatives per HPI.  Current Outpatient Medications on File Prior to Visit  Medication Sig Dispense Refill   aspirin 325 MG tablet Take 325 mg by mouth as needed for pain.     CALCIUM PO Take 400 mg by mouth. 2 capsules  daily     clotrimazole-betamethasone (LOTRISONE) cream Apply 1 application topically daily. 30 g 0   Diclofenac Sodium (PENNSAID) 2 % SOLN Place 2 g onto the skin 2 (two) times daily. 112  g 3   ferrous sulfate 325 (65 FE) MG tablet Take 325 mg by mouth daily with breakfast.     ibuprofen (ADVIL,MOTRIN) 200 MG tablet Take 200 mg by mouth every 6 (six) hours as needed.     Lactobacillus (ACIDOPHILUS PO) Take by mouth. 2 a day     lovastatin (MEVACOR) 20 MG tablet Take 1 tablet (20 mg total) by mouth at bedtime. 30 tablet 3   Multiple Vitamin (MULTIVITAMIN) tablet Take 1 tablet by mouth daily.     NON FORMULARY      nystatin cream (MYCOSTATIN) Apply 1 application topically 2 (two) times daily. 30 g 0   No current facility-administered medications on file prior to visit.   Past Medical History:  Diagnosis Date   Colitis, ulcerative chronic (HCC)    Dr Terance Hart   Cystitis, interstitial    Endometriosis    Goiter    Hyperprolactinemia (HCC)    Migraine aura without headache    Polycystic ovarian syndrome    Allergies  Allergen Reactions   Codeine Other (See Comments)    Other reaction(s): Dizziness (intolerance)   Social History   Socioeconomic History   Marital status: Single    Spouse name: Not on file   Number of children: Not on file   Years of education: Not on file   Highest education level: Not on file  Occupational History   Not on file  Tobacco Use   Smoking status: Never   Smokeless tobacco: Never  Vaping Use   Vaping Use: Never used  Substance and Sexual Activity   Alcohol use: Yes    Alcohol/week: 0.0 standard drinks of alcohol    Comment: socially 1-2 a month   Drug use: No   Sexual activity: Yes    Partners: Male    Birth control/protection: Pill  Other Topics Concern   Not on file  Social History Narrative   Not on file   Social Determinants of Health   Financial Resource Strain: Not on file  Food Insecurity: Not on file  Transportation Needs: Not on file  Physical Activity: Not on file  Stress: Not on file  Social Connections: Not on file   Vitals:   06/21/22 1004  BP: 110/78  Pulse: 96  Resp: 18  SpO2: 97%   Body mass  index is 29.61 kg/m.  Physical Exam Vitals and nursing note reviewed.  Constitutional:      General: She is not in acute distress.    Appearance: She is well-developed.  HENT:     Head: Normocephalic and atraumatic.     Mouth/Throat:     Mouth: Mucous membranes are moist.     Pharynx: Oropharynx is clear.  Eyes:     Conjunctiva/sclera: Conjunctivae normal.     Pupils:     Right eye: Pupil is reactive.     Left eye: Pupil is reactive.     Funduscopic exam:    Right eye: No hemorrhage, exudate or papilledema.        Left eye: No hemorrhage, exudate or papilledema.  Cardiovascular:     Rate and Rhythm: Normal rate and regular rhythm.     Heart sounds: No murmur heard. Pulmonary:     Effort: Pulmonary effort is normal. No respiratory distress.  Breath sounds: Normal breath sounds.  Abdominal:     Palpations: Abdomen is soft. There is no hepatomegaly or mass.     Tenderness: There is no abdominal tenderness.  Musculoskeletal:     Cervical back: Spasms present. No bony tenderness. Pain with movement and muscular tenderness present. Normal range of motion.       Back:  Lymphadenopathy:     Cervical: No cervical adenopathy.  Skin:    General: Skin is warm.     Findings: No erythema or rash.  Neurological:     General: No focal deficit present.     Mental Status: She is alert and oriented to person, place, and time.     Cranial Nerves: No cranial nerve deficit.     Motor: No weakness.     Gait: Gait normal.     Deep Tendon Reflexes:     Reflex Scores:      Bicep reflexes are 2+ on the right side and 2+ on the left side.      Patellar reflexes are 2+ on the right side and 2+ on the left side. Psychiatric:        Mood and Affect: Mood and affect normal.   ASSESSMENT AND PLAN:  Ms. Mindy Lowe was seen today for pressure in back of head.  Diagnoses and all orders for this visit: Orders Placed This Encounter  Procedures   Basic metabolic panel   CBC   TSH   Lab  Results  Component Value Date   CREATININE 0.79 06/21/2022   BUN 11 06/21/2022   NA 136 06/21/2022   K 4.0 06/21/2022   CL 100 06/21/2022   CO2 29 06/21/2022   Lab Results  Component Value Date   WBC 5.4 06/21/2022   HGB 14.0 06/21/2022   HCT 41.4 06/21/2022   MCV 96.4 06/21/2022   PLT 353.0 06/21/2022   Lab Results  Component Value Date   TSH 1.55 06/21/2022   Fatigue, unspecified type We discussed possible etiologies: Systemic illness, immunologic,endocrinology,sleep disorder, psychiatric/psychologic, infectious,medications side effects, and idiopathic. Examination today does not suggest a serious process. Further recommendations will be given according to lab results.  Dyspepsia She agrees with PPI trial. Omeprazole 40 mg daily 30 min before breakfast for 4-5 weeks. Avoid NSAID's. GERD precautions.  -     omeprazole (PRILOSEC) 40 MG capsule; Take 1 capsule (40 mg total) by mouth daily.  Occipital headache Resolved. Hx is not the typical for migraines. ? Tension headache. I do not think imaging is needed at this time. Instructed about warning signs.  Nausea and vomiting in adult patient Symptoms she had before headache with diarrhea could have been indigestion/gastroenteritis episode. Vomiting resolved. Nausea started back yesterday, could have been caused by taking Advil and Aspirin on empty stomach. Small and frequent meals. Omeprazole x 4-5 weeks.  Return if symptoms worsen or fail to improve.  Mindy Lowe G. [place], MD  [facility]. [place] office.

## 2022-06-21 ENCOUNTER — Ambulatory Visit (INDEPENDENT_AMBULATORY_CARE_PROVIDER_SITE_OTHER): Payer: 59 | Admitting: Family Medicine

## 2022-06-21 ENCOUNTER — Encounter: Payer: Self-pay | Admitting: Family Medicine

## 2022-06-21 VITALS — BP 110/78 | HR 96 | Resp 18 | Ht 64.0 in | Wt 172.5 lb

## 2022-06-21 DIAGNOSIS — R5383 Other fatigue: Secondary | ICD-10-CM

## 2022-06-21 DIAGNOSIS — R519 Headache, unspecified: Secondary | ICD-10-CM | POA: Diagnosis not present

## 2022-06-21 DIAGNOSIS — R1013 Epigastric pain: Secondary | ICD-10-CM | POA: Diagnosis not present

## 2022-06-21 DIAGNOSIS — R112 Nausea with vomiting, unspecified: Secondary | ICD-10-CM | POA: Diagnosis not present

## 2022-06-21 LAB — TSH: TSH: 1.55 u[IU]/mL (ref 0.35–5.50)

## 2022-06-21 LAB — BASIC METABOLIC PANEL
BUN: 11 mg/dL (ref 6–23)
CO2: 29 mEq/L (ref 19–32)
Calcium: 9.3 mg/dL (ref 8.4–10.5)
Chloride: 100 mEq/L (ref 96–112)
Creatinine, Ser: 0.79 mg/dL (ref 0.40–1.20)
GFR: 89.45 mL/min (ref >60.00)
Glucose, Bld: 75 mg/dL (ref 70–99)
Potassium: 4 mEq/L (ref 3.5–5.1)
Sodium: 136 mEq/L (ref 135–145)

## 2022-06-21 LAB — CBC
HCT: 41.4 % (ref 36.0–46.0)
Hemoglobin: 14 g/dL (ref 12.0–15.0)
MCHC: 33.8 g/dL (ref 30.0–36.0)
MCV: 96.4 fl (ref 78.0–100.0)
Platelets: 353 10*3/uL (ref 150.0–400.0)
RBC: 4.3 Mil/uL (ref 3.87–5.11)
RDW: 13 % (ref 11.5–15.5)
WBC: 5.4 10*3/uL (ref 4.0–10.5)

## 2022-06-21 MED ORDER — OMEPRAZOLE 40 MG PO CPDR: 40.0000 mg | DELAYED_RELEASE_CAPSULE | Freq: Every day | ORAL | 0 refills | Status: DC

## 2022-06-21 NOTE — Patient Instructions (Addendum)
A few things to remember from today's visit:  Fatigue, unspecified type - Plan: Basic metabolic panel, CBC, TSH  Dyspepsia - Plan: omeprazole (PRILOSEC) 40 MG capsule  Occipital headache  Nausea and vomiting in adult patient  If you need refills for medications you take chronically, please call your pharmacy. Do not use My Chart to request refills or for acute issues that need immediate attention. If you send a my chart message, it may take a few days to be addressed, specially if I am not in the office.  Please be sure medication list is accurate. If a new problem present, please set up appointment sooner than planned today.  Headache could have been tension like headache and nausea and vomiting caused by something you ate. Monitor for new symptoms.  Omeprazole 40 mg before breakfast for 30 days.

## 2022-07-29 ENCOUNTER — Other Ambulatory Visit: Payer: Self-pay | Admitting: Family Medicine

## 2022-07-29 DIAGNOSIS — B372 Candidiasis of skin and nail: Secondary | ICD-10-CM

## 2022-07-29 DIAGNOSIS — N649 Disorder of breast, unspecified: Secondary | ICD-10-CM

## 2022-08-07 MED ORDER — CLOTRIMAZOLE-BETAMETHASONE 1-0.05 % EX CREA: 1.0000 | TOPICAL_CREAM | Freq: Every day | CUTANEOUS | 0 refills | Status: DC

## 2022-08-07 NOTE — Telephone Encounter (Signed)
She was referred to dermatologist.

## 2022-08-08 ENCOUNTER — Telehealth: Payer: Self-pay | Admitting: Family Medicine

## 2022-08-08 NOTE — Telephone Encounter (Signed)
Pt checking on why her nystatin cream (MYCOSTATIN) was denied and states she would prefer this one be filled as opposed to the other.

## 2022-08-12 NOTE — Telephone Encounter (Signed)
Patient calling progress of this request.

## 2022-08-14 ENCOUNTER — Other Ambulatory Visit: Payer: Self-pay | Admitting: Family Medicine

## 2022-08-14 DIAGNOSIS — B372 Candidiasis of skin and nail: Secondary | ICD-10-CM

## 2022-08-14 MED ORDER — NYSTATIN 100000 UNIT/GM EX CREA: 1.0000 | TOPICAL_CREAM | Freq: Two times a day (BID) | CUTANEOUS | 0 refills | Status: DC

## 2022-08-14 NOTE — Telephone Encounter (Signed)
Rx sent. Thanks, BJ 

## 2022-09-16 NOTE — Progress Notes (Unsigned)
HPI: [name] is a 46 y.o. female, who is here today for her routine physical.  Last CPE: 09/14/21  Regular exercise 3 or more time per week: *** Following a healthy diet: ***  Chronic medical problems: ***  Immunization History  Administered Date(s) Administered   Influenza,inj,Quad PF,6+ Mos 08/11/2020, 09/14/2021   Td 11/16/2005   Tdap 10/03/2009, 08/11/2020   Health Maintenance  Topic Date Due   PAP SMEAR-Modifier  05/03/2018   COLONOSCOPY (Pts 45-24yr Insurance coverage will need to be confirmed)  Never done   INFLUENZA VACCINE  05/03/2022   DTaP/Tdap/Td (4 - Td or Tdap) 08/11/2030   Hepatitis C Screening  Completed   HIV Screening  Completed   HPV VACCINES  Aged Out    She has *** concerns today.  Review of Systems  Current Outpatient Medications on File Prior to Visit  Medication Sig Dispense Refill   aspirin 325 MG tablet Take 325 mg by mouth as needed for pain.     CALCIUM PO Take 400 mg by mouth. 2 capsules daily     Diclofenac Sodium (PENNSAID) 2 % SOLN Place 2 g onto the skin 2 (two) times daily. 112 g 3   ferrous sulfate 325 (65 FE) MG tablet Take 325 mg by mouth daily with breakfast.     ibuprofen (ADVIL,MOTRIN) 200 MG tablet Take 200 mg by mouth every 6 (six) hours as needed.     Lactobacillus (ACIDOPHILUS PO) Take by mouth. 2 a day     lovastatin (MEVACOR) 20 MG tablet Take 1 tablet (20 mg total) by mouth at bedtime. 30 tablet 3   Multiple Vitamin (MULTIVITAMIN) tablet Take 1 tablet by mouth daily.     NON FORMULARY      nystatin cream (MYCOSTATIN) Apply 1 Application topically 2 (two) times daily. 30 g 0   omeprazole (PRILOSEC) 40 MG capsule Take 1 capsule (40 mg total) by mouth daily. 30 capsule 0   No current facility-administered medications on file prior to visit.    Past Medical History:  Diagnosis Date   Colitis, ulcerative chronic (HCC)    Dr PTerance Hart  Cystitis, interstitial    Endometriosis    Goiter     Hyperprolactinemia (HCC)    Migraine aura without headache    Polycystic ovarian syndrome     Past Surgical History:  Procedure Laterality Date   APPENDECTOMY     DIAGNOSTIC LAPAROSCOPY      Allergies  Allergen Reactions   Codeine Other (See Comments)    Other reaction(s): Dizziness (intolerance)    Family History  Problem Relation Age of Onset   Lung cancer Maternal Grandfather    Cancer Maternal Grandfather    Ovarian cancer Paternal Grandmother        then developed pancreatic   Cancer Paternal Grandmother    Diabetes Paternal Grandmother    Hyperlipidemia Mother    Hypertension Mother    Arthritis Mother    Diabetes Father    Early death Father    Heart attack Father    Early death Maternal Grandmother     Social History   Socioeconomic History   Marital status: Single    Spouse name: Not on file   Number of children: Not on file   Years of education: Not on file   Highest education level: Not on file  Occupational History   Not on file  Tobacco Use   Smoking status: Never   Smokeless tobacco: Never  Vaping Use  Vaping Use: Never used  Substance and Sexual Activity   Alcohol use: Yes    Alcohol/week: 0.0 standard drinks of alcohol    Comment: socially 1-2 a month   Drug use: No   Sexual activity: Yes    Partners: Male    Birth control/protection: Pill  Other Topics Concern   Not on file  Social History Narrative   Not on file   Social Determinants of Health   Financial Resource Strain: Not on file  Food Insecurity: Not on file  Transportation Needs: Not on file  Physical Activity: Not on file  Stress: Not on file  Social Connections: Not on file    There were no vitals filed for this visit. There is no height or weight on file to calculate BMI.  Wt Readings from Last 3 Encounters:  06/21/22 172 lb 8 oz (78.2 kg)  09/14/21 167 lb 4 oz (75.9 kg)  03/02/21 167 lb 2 oz (75.8 kg)    Physical Exam  ASSESSMENT AND PLAN: Mindy Lowe was here today annual physical examination.  No orders of the defined types were placed in this encounter.   There are no diagnoses linked to this encounter.  There are no diagnoses linked to this encounter.  No follow-ups on file.  Rosealyn Little G. [place], MD  [facility]. [place] office.

## 2022-09-19 ENCOUNTER — Encounter: Payer: Self-pay | Admitting: Family Medicine

## 2022-09-19 ENCOUNTER — Ambulatory Visit (INDEPENDENT_AMBULATORY_CARE_PROVIDER_SITE_OTHER): Payer: 59 | Admitting: Family Medicine

## 2022-09-19 VITALS — BP 110/70 | HR 100 | Temp 98.6°F | Resp 12 | Ht 64.0 in | Wt 170.0 lb

## 2022-09-19 DIAGNOSIS — E785 Hyperlipidemia, unspecified: Secondary | ICD-10-CM | POA: Diagnosis not present

## 2022-09-19 DIAGNOSIS — Z1211 Encounter for screening for malignant neoplasm of colon: Secondary | ICD-10-CM | POA: Diagnosis not present

## 2022-09-19 DIAGNOSIS — Z1329 Encounter for screening for other suspected endocrine disorder: Secondary | ICD-10-CM | POA: Diagnosis not present

## 2022-09-19 DIAGNOSIS — K5190 Ulcerative colitis, unspecified, without complications: Secondary | ICD-10-CM

## 2022-09-19 DIAGNOSIS — J029 Acute pharyngitis, unspecified: Secondary | ICD-10-CM | POA: Diagnosis not present

## 2022-09-19 DIAGNOSIS — D509 Iron deficiency anemia, unspecified: Secondary | ICD-10-CM | POA: Diagnosis not present

## 2022-09-19 DIAGNOSIS — Z0000 Encounter for general adult medical examination without abnormal findings: Secondary | ICD-10-CM | POA: Diagnosis not present

## 2022-09-19 DIAGNOSIS — Z13228 Encounter for screening for other metabolic disorders: Secondary | ICD-10-CM | POA: Diagnosis not present

## 2022-09-19 DIAGNOSIS — J310 Chronic rhinitis: Secondary | ICD-10-CM

## 2022-09-19 DIAGNOSIS — R051 Acute cough: Secondary | ICD-10-CM

## 2022-09-19 DIAGNOSIS — Z130 Encounter for screening for diseases of the blood and blood-forming organs and certain disorders involving the immune mechanism: Secondary | ICD-10-CM

## 2022-09-19 DIAGNOSIS — E559 Vitamin D deficiency, unspecified: Secondary | ICD-10-CM

## 2022-09-19 LAB — COMPREHENSIVE METABOLIC PANEL
ALT: 19 U/L (ref 0–35)
AST: 17 U/L (ref 0–37)
Albumin: 4.1 g/dL (ref 3.5–5.2)
Alkaline Phosphatase: 82 U/L (ref 39–117)
BUN: 10 mg/dL (ref 6–23)
CO2: 28 mEq/L (ref 19–32)
Calcium: 9.4 mg/dL (ref 8.4–10.5)
Chloride: 102 mEq/L (ref 96–112)
Creatinine, Ser: 0.8 mg/dL (ref 0.40–1.20)
GFR: 87.96 mL/min (ref >60.00)
Glucose, Bld: 101 mg/dL — ABNORMAL HIGH (ref 70–99)
Potassium: 4.2 mEq/L (ref 3.5–5.1)
Sodium: 139 mEq/L (ref 135–145)
Total Bilirubin: 0.5 mg/dL (ref 0.2–1.2)
Total Protein: 7.8 g/dL (ref 6.0–8.3)

## 2022-09-19 LAB — FERRITIN: Ferritin: 91.2 ng/mL (ref 10.0–291.0)

## 2022-09-19 LAB — LIPID PANEL
Cholesterol: 200 mg/dL (ref 0–200)
HDL: 62.3 mg/dL (ref >39.00)
LDL Cholesterol: 110 mg/dL — ABNORMAL HIGH (ref 0–99)
NonHDL: 137.83
Total CHOL/HDL Ratio: 3
Triglycerides: 137 mg/dL (ref 0.0–149.0)
VLDL: 27.4 mg/dL (ref 0.0–40.0)

## 2022-09-19 LAB — CBC
HCT: 40.7 % (ref 36.0–46.0)
Hemoglobin: 13.9 g/dL (ref 12.0–15.0)
MCHC: 34.1 g/dL (ref 30.0–36.0)
MCV: 94.8 fl (ref 78.0–100.0)
Platelets: 431 10*3/uL — ABNORMAL HIGH (ref 150.0–400.0)
RBC: 4.29 Mil/uL (ref 3.87–5.11)
RDW: 13.4 % (ref 11.5–15.5)
WBC: 7.9 10*3/uL (ref 4.0–10.5)

## 2022-09-19 LAB — HEMOGLOBIN A1C: Hgb A1c MFr Bld: 5.8 % (ref 4.6–6.5)

## 2022-09-19 LAB — IRON: Iron: 40 ug/dL — ABNORMAL LOW (ref 42–145)

## 2022-09-19 LAB — POCT RAPID STREP A (OFFICE): Rapid Strep A Screen: NEGATIVE

## 2022-09-19 MED ORDER — FLUTICASONE PROPIONATE 50 MCG/ACT NA SUSP: 1.0000 | Freq: Two times a day (BID) | NASAL | 0 refills | Status: AC

## 2022-09-19 MED ORDER — PREDNISONE 20 MG PO TABS: 40.0000 mg | ORAL_TABLET | Freq: Every day | ORAL | 0 refills | Status: AC

## 2022-09-19 MED ORDER — BENZONATATE 100 MG PO CAPS: 100.0000 mg | ORAL_CAPSULE | Freq: Two times a day (BID) | ORAL | 0 refills | Status: AC | PRN

## 2022-09-19 NOTE — Assessment & Plan Note (Signed)
We discussed possible causes of respiratory symptoms reported today,?  Allergies and residual symptoms after recent URI. She reported some throat discomfort, described more as "itching."  Rapid strep done in the office negative. Recommend symptomatic treatment. Short course of prednisone will help, we discussed side effects, instructed to take it with breakfast for 3 days. Flonase nasal spray daily and night for 10 to 14 days then as needed. Nasal saline irrigations. Recommend benzonatate for cough. Instructed about warning signs. Follow-up as needed.

## 2022-09-19 NOTE — Assessment & Plan Note (Addendum)
We discussed the importance of regular physical activity and healthy diet for prevention of chronic illness and/or complications. Preventive guidelines reviewed. Vaccination: Up to date, prefers to hold on flu vaccine for now. Stressed the importance of arranging appt with ehr gyn for ehr pap smear and arranging appt for her mammogram. Next CPE in a year.

## 2022-09-19 NOTE — Assessment & Plan Note (Signed)
Inflammation, she has been asymptomatic for 15+ years.

## 2022-09-19 NOTE — Assessment & Plan Note (Signed)
She has not been consistent with taking lovastatin daily. Further recommendations will be given according to 10 years CVD risk score and lipid panel numbers.

## 2022-09-19 NOTE — Patient Instructions (Addendum)
A few things to remember from today's visit:  Routine general medical examination at a health care facility  Screening for endocrine, metabolic and immunity disorder - Plan: Hemoglobin A1c  Colon cancer screening - Plan: Ambulatory referral to Gastroenterology  Sore throat - Plan: POC Rapid Strep A  Acute cough - Plan: benzonatate (TESSALON) 100 MG capsule  Rhinitis, unspecified type - Plan: fluticasone (FLONASE) 50 MCG/ACT nasal spray, predniSONE (DELTASONE) 20 MG tablet  Iron deficiency anemia, unspecified iron deficiency anemia type - Plan: CBC, Comprehensive metabolic panel, Iron, Ferritin  Hyperlipidemia, unspecified hyperlipidemia type - Plan: Lipid panel  Nasal saline irrigations as needed. Flonase intranasal daily at bedtime for 10-14 days then as needed. Plain Mucinex may also help. If persistent cough we could arrange a chest X ray.  Prednisone for 3 days may help with congestion. Pop ears a few times through the day to help with ear congestion. Monitor for fever.  Do not use My Chart to request refills or for acute issues that need immediate attention. If you send a my chart message, it may take a few days to be addressed, specially if I am not in the office.  Please be sure medication list is accurate. If a new problem present, please set up appointment sooner than planned today.  Health Maintenance, Female Adopting a healthy lifestyle and getting preventive care are important in promoting health and wellness. Ask your health care provider about: The right schedule for you to have regular tests and exams. Things you can do on your own to prevent diseases and keep yourself healthy. What should I know about diet, weight, and exercise? Eat a healthy diet  Eat a diet that includes plenty of vegetables, fruits, low-fat dairy products, and lean protein. Do not eat a lot of foods that are high in solid fats, added sugars, or sodium. Maintain a healthy weight Body mass  index (BMI) is used to identify weight problems. It estimates body fat based on height and weight. Your health care provider can help determine your BMI and help you achieve or maintain a healthy weight. Get regular exercise Get regular exercise. This is one of the most important things you can do for your health. Most adults should: Exercise for at least 150 minutes each week. The exercise should increase your heart rate and make you sweat (moderate-intensity exercise). Do strengthening exercises at least twice a week. This is in addition to the moderate-intensity exercise. Spend less time sitting. Even light physical activity can be beneficial. Watch cholesterol and blood lipids Have your blood tested for lipids and cholesterol at 47 years of age, then have this test every 5 years. Have your cholesterol levels checked more often if: Your lipid or cholesterol levels are high. You are older than 47 years of age. You are at high risk for heart disease. What should I know about cancer screening? Depending on your health history and family history, you may need to have cancer screening at various ages. This may include screening for: Breast cancer. Cervical cancer. Colorectal cancer. Skin cancer. Lung cancer. What should I know about heart disease, diabetes, and high blood pressure? Blood pressure and heart disease High blood pressure causes heart disease and increases the risk of stroke. This is more likely to develop in people who have high blood pressure readings or are overweight. Have your blood pressure checked: Every 3-5 years if you are 70-45 years of age. Every year if you are 71 years old or older. Diabetes Have regular  diabetes screenings. This checks your fasting blood sugar level. Have the screening done: Once every three years after age 45 if you are at a normal weight and have a low risk for diabetes. More often and at a younger age if you are overweight or have a high risk  for diabetes. What should I know about preventing infection? Hepatitis B If you have a higher risk for hepatitis B, you should be screened for this virus. Talk with your health care provider to find out if you are at risk for hepatitis B infection. Hepatitis C Testing is recommended for: Everyone born from [birth year] through 1966. Anyone with known risk factors for hepatitis C. Sexually transmitted infections (STIs) Get screened for STIs, including gonorrhea and chlamydia, if: You are sexually active and are younger than 47 years of age. You are older than 47 years of age and your health care provider tells you that you are at risk for this type of infection. Your sexual activity has changed since you were last screened, and you are at increased risk for chlamydia or gonorrhea. Ask your health care provider if you are at risk. Ask your health care provider about whether you are at high risk for HIV. Your health care provider may recommend a prescription medicine to help prevent HIV infection. If you choose to take medicine to prevent HIV, you should first get tested for HIV. You should then be tested every 3 months for as long as you are taking the medicine. Pregnancy If you are about to stop having your period (premenopausal) and you may become pregnant, seek counseling before you get pregnant. Take 400 to 800 micrograms (mcg) of folic acid every day if you become pregnant. Ask for birth control (contraception) if you want to prevent pregnancy. Osteoporosis and menopause Osteoporosis is a disease in which the bones lose minerals and strength with aging. This can result in bone fractures. If you are 44 years old or older, or if you are at risk for osteoporosis and fractures, ask your health care provider if you should: Be screened for bone loss. Take a calcium or vitamin D supplement to lower your risk of fractures. Be given hormone replacement therapy (HRT) to treat symptoms of menopause. Follow  these instructions at home: Alcohol use Do not drink alcohol if: Your health care provider tells you not to drink. You are pregnant, may be pregnant, or are planning to become pregnant. If you drink alcohol: Limit how much you have to: 0-1 drink a day. Know how much alcohol is in your drink. In the [place] one drink equals one 12 oz bottle of beer (355 mL), one 5 oz glass of wine (148 mL), or one 1 oz glass of hard liquor (44 mL). Lifestyle Do not use any products that contain nicotine or tobacco. These products include cigarettes, chewing tobacco, and vaping devices, such as e-cigarettes. If you need help quitting, ask your health care provider. Do not use street drugs. Do not share needles. Ask your health care provider for help if you need support or information about quitting drugs. General instructions Schedule regular health, dental, and eye exams. Stay current with your vaccines. Tell your health care provider if: You often feel depressed. You have ever been abused or do not feel safe at home. Summary Adopting a healthy lifestyle and getting preventive care are important in promoting health and wellness. Follow your health care provider's instructions about healthy diet, exercising, and getting tested or screened for diseases. Follow your  health care provider's instructions on monitoring your cholesterol and blood pressure. This information is not intended to replace advice given to you by your health care provider. Make sure you discuss any questions you have with your health care provider. Document Revised: 02/08/2021 Document Reviewed: 02/08/2021 Elsevier Patient Education  [place].

## 2022-09-19 NOTE — Assessment & Plan Note (Signed)
Continue ferrous sulfate 325 mg daily. Further recommendation will be given according to CBC and iron studies.

## 2022-09-21 ENCOUNTER — Encounter: Payer: Self-pay | Admitting: General Practice

## 2022-10-20 ENCOUNTER — Encounter: Payer: Self-pay | Admitting: Adult Health

## 2022-10-20 ENCOUNTER — Telehealth (INDEPENDENT_AMBULATORY_CARE_PROVIDER_SITE_OTHER): Payer: 59 | Admitting: Adult Health

## 2022-10-20 VITALS — HR 98 | Wt 170.0 lb

## 2022-10-20 DIAGNOSIS — U071 COVID-19: Secondary | ICD-10-CM

## 2022-10-20 MED ORDER — NIRMATRELVIR/RITONAVIR (PAXLOVID)TABLET: 3.0000 | ORAL_TABLET | Freq: Two times a day (BID) | ORAL | 0 refills | Status: DC

## 2022-10-20 NOTE — Progress Notes (Signed)
Virtual Visit via Video Note  I connected with Mindy Lowe on 10/20/22 at  1:15 PM EST by a video enabled telemedicine application and verified that I am speaking with the correct person using two identifiers.  Location patient: home Location provider:work or home office Persons participating in the virtual visit: patient, provider  I discussed the limitations of evaluation and management by telemedicine and the availability of in person appointments. The patient expressed understanding and agreed to proceed.   HPI:  47 year old female who is being evaluated today for an acute issue.  She tested positive for COVID-19 infection at home this morning.  Her symptoms started 2 days ago with sneezing and ear pain and has progressed into body aches, joint pain, nasal congestion, rhinorrhea, constant coughing, and fatigue.  She is interested in antiviral therapy she has never been on it despite having COVID-19 for different times.  ROS: See pertinent positives and negatives per HPI.  Past Medical History:  Diagnosis Date   Colitis, ulcerative chronic (HCC)    Dr Terance Hart   Cystitis, interstitial    Endometriosis    Goiter    Hyperprolactinemia (HCC)    Migraine aura without headache    Polycystic ovarian syndrome     Past Surgical History:  Procedure Laterality Date   APPENDECTOMY     DIAGNOSTIC LAPAROSCOPY      Family History  Problem Relation Age of Onset   Lung cancer Maternal Grandfather    Cancer Maternal Grandfather    Ovarian cancer Paternal Grandmother        then developed pancreatic   Cancer Paternal Grandmother    Diabetes Paternal Grandmother    Hyperlipidemia Mother    Hypertension Mother    Arthritis Mother    Diabetes Father    Early death Father    Heart attack Father    Early death Maternal Grandmother        Current Outpatient Medications:    aspirin 325 MG tablet, Take 325 mg by mouth as needed for pain., Disp: , Rfl:    CALCIUM PO, Take 400 mg  by mouth. 2 capsules daily, Disp: , Rfl:    Diclofenac Sodium (PENNSAID) 2 % SOLN, Place 2 g onto the skin 2 (two) times daily., Disp: 112 g, Rfl: 3   ferrous sulfate 325 (65 FE) MG tablet, Take 325 mg by mouth daily with breakfast., Disp: , Rfl:    fluticasone (FLONASE) 50 MCG/ACT nasal spray, Place 1 spray into both nostrils 2 (two) times daily., Disp: 16 g, Rfl: 0   ibuprofen (ADVIL,MOTRIN) 200 MG tablet, Take 200 mg by mouth every 6 (six) hours as needed., Disp: , Rfl:    Lactobacillus (ACIDOPHILUS PO), Take by mouth. 2 a day, Disp: , Rfl:    Multiple Vitamin (MULTIVITAMIN) tablet, Take 1 tablet by mouth daily., Disp: , Rfl:    nirmatrelvir/ritonavir (PAXLOVID) 20 x 150 MG & 10 x '100MG'$  TABS, Take 3 tablets by mouth 2 (two) times daily for 5 days. (Take nirmatrelvir 150 mg two tablets twice daily for 5 days and ritonavir 100 mg one tablet twice daily for 5 days) Patient GFR is 60, Disp: 30 tablet, Rfl: 0   NON FORMULARY, , Disp: , Rfl:    nystatin cream (MYCOSTATIN), Apply 1 Application topically 2 (two) times daily., Disp: 30 g, Rfl: 0   omeprazole (PRILOSEC) 40 MG capsule, Take 1 capsule (40 mg total) by mouth daily., Disp: 30 capsule, Rfl: 0  EXAM:  VITALS per patient if applicable:  GENERAL: alert, oriented, appears well and in no acute distress  HEENT: atraumatic, conjunttiva clear, no obvious abnormalities on inspection of external nose and ears  NECK: normal movements of the head and neck  LUNGS: on inspection no signs of respiratory distress, breathing rate appears normal, no obvious gross SOB, gasping or wheezing  CV: no obvious cyanosis  MS: moves all visible extremities without noticeable abnormality  PSYCH/NEURO: pleasant and cooperative, no obvious depression or anxiety, speech and thought processing grossly intact  ASSESSMENT AND PLAN:  Discussed the following assessment and plan:  1. COVID-19 virus infection  - nirmatrelvir/ritonavir (PAXLOVID) 20 x 150 MG & 10 x  '100MG'$  TABS; Take 3 tablets by mouth 2 (two) times daily for 5 days. (Take nirmatrelvir 150 mg two tablets twice daily for 5 days and ritonavir 100 mg one tablet twice daily for 5 days) Patient GFR is 60  Dispense: 30 tablet; Refill: 0 - Stay hydrated and rest  - Follow up if symptoms worsen      I discussed the assessment and treatment plan with the patient. The patient was provided an opportunity to ask questions and all were answered. The patient agreed with the plan and demonstrated an understanding of the instructions.   The patient was advised to call back or seek an in-person evaluation if the symptoms worsen or if the condition fails to improve as anticipated.   Dorothyann Peng, NP

## 2022-10-21 MED ORDER — NIRMATRELVIR/RITONAVIR (PAXLOVID)TABLET: 3.0000 | ORAL_TABLET | Freq: Two times a day (BID) | ORAL | 0 refills | Status: AC

## 2022-10-21 NOTE — Telephone Encounter (Signed)
Please advise 

## 2022-11-30 ENCOUNTER — Encounter: Payer: 59 | Admitting: Obstetrics and Gynecology

## 2022-12-09 ENCOUNTER — Encounter: Payer: 59 | Admitting: Gastroenterology

## 2023-10-02 ENCOUNTER — Ambulatory Visit (INDEPENDENT_AMBULATORY_CARE_PROVIDER_SITE_OTHER): Payer: 59 | Admitting: Family Medicine

## 2023-10-02 ENCOUNTER — Encounter: Payer: Self-pay | Admitting: Family Medicine

## 2023-10-02 VITALS — BP 112/70 | HR 75 | Temp 98.6°F | Ht 65.75 in | Wt 172.2 lb

## 2023-10-02 DIAGNOSIS — Z130 Encounter for screening for diseases of the blood and blood-forming organs and certain disorders involving the immune mechanism: Secondary | ICD-10-CM | POA: Diagnosis not present

## 2023-10-02 DIAGNOSIS — R7989 Other specified abnormal findings of blood chemistry: Secondary | ICD-10-CM

## 2023-10-02 DIAGNOSIS — E559 Vitamin D deficiency, unspecified: Secondary | ICD-10-CM | POA: Diagnosis not present

## 2023-10-02 DIAGNOSIS — E785 Hyperlipidemia, unspecified: Secondary | ICD-10-CM

## 2023-10-02 DIAGNOSIS — Z1329 Encounter for screening for other suspected endocrine disorder: Secondary | ICD-10-CM

## 2023-10-02 DIAGNOSIS — Z0000 Encounter for general adult medical examination without abnormal findings: Secondary | ICD-10-CM

## 2023-10-02 DIAGNOSIS — Z13228 Encounter for screening for other metabolic disorders: Secondary | ICD-10-CM

## 2023-10-02 DIAGNOSIS — Z1211 Encounter for screening for malignant neoplasm of colon: Secondary | ICD-10-CM

## 2023-10-02 LAB — COMPREHENSIVE METABOLIC PANEL
ALT: 23 U/L (ref 0–35)
AST: 20 U/L (ref 0–37)
Albumin: 4.6 g/dL (ref 3.5–5.2)
Alkaline Phosphatase: 92 U/L (ref 39–117)
BUN: 14 mg/dL (ref 6–23)
CO2: 28 meq/L (ref 19–32)
Calcium: 9.5 mg/dL (ref 8.4–10.5)
Chloride: 101 meq/L (ref 96–112)
Creatinine, Ser: 0.79 mg/dL (ref 0.40–1.20)
GFR: 88.65 mL/min (ref >60.00)
Glucose, Bld: 97 mg/dL (ref 70–99)
Potassium: 3.7 meq/L (ref 3.5–5.1)
Sodium: 138 meq/L (ref 135–145)
Total Bilirubin: 0.3 mg/dL (ref 0.2–1.2)
Total Protein: 8.2 g/dL (ref 6.0–8.3)

## 2023-10-02 LAB — LIPID PANEL
Cholesterol: 256 mg/dL — ABNORMAL HIGH (ref 0–200)
HDL: 63.9 mg/dL (ref >39.00)
LDL Cholesterol: 149 mg/dL — ABNORMAL HIGH (ref 0–99)
NonHDL: 192.21
Total CHOL/HDL Ratio: 4
Triglycerides: 214 mg/dL — ABNORMAL HIGH (ref 0.0–149.0)
VLDL: 42.8 mg/dL — ABNORMAL HIGH (ref 0.0–40.0)

## 2023-10-02 LAB — CBC
HCT: 41.4 % (ref 36.0–46.0)
Hemoglobin: 14.1 g/dL (ref 12.0–15.0)
MCHC: 33.9 g/dL (ref 30.0–36.0)
MCV: 95.6 fL (ref 78.0–100.0)
Platelets: 352 10*3/uL (ref 150.0–400.0)
RBC: 4.34 Mil/uL (ref 3.87–5.11)
RDW: 13.7 % (ref 11.5–15.5)
WBC: 4.6 10*3/uL (ref 4.0–10.5)

## 2023-10-02 LAB — VITAMIN D 25 HYDROXY (VIT D DEFICIENCY, FRACTURES): VITD: 44.48 ng/mL (ref 30.00–100.00)

## 2023-10-02 LAB — HEMOGLOBIN A1C: Hgb A1c MFr Bld: 6.2 % (ref 4.6–6.5)

## 2023-10-02 NOTE — Patient Instructions (Addendum)
A few things to remember from today's visit:  Routine general medical examination at a health care facility  Hyperlipidemia, unspecified hyperlipidemia type - Plan: Lipid panel, Comprehensive metabolic panel  Vitamin D deficiency, unspecified - Plan: VITAMIN D 25 Hydroxy (Vit-D Deficiency, Fractures)  Elevated platelet count - Plan: CBC  Screening for endocrine, metabolic and immunity disorder - Plan: Hemoglobin A1c  Do not use My Chart to request refills or for acute issues that need immediate attention. If you send a my chart message, it may take a few days to be addressed, specially if I am not in the office.  Please be sure medication list is accurate. If a new problem present, please set up appointment sooner than planned today.  Health Maintenance, Female Adopting a healthy lifestyle and getting preventive care are important in promoting health and wellness. Ask your health care provider about: The right schedule for you to have regular tests and exams. Things you can do on your own to prevent diseases and keep yourself healthy. What should I know about diet, weight, and exercise? Eat a healthy diet  Eat a diet that includes plenty of vegetables, fruits, low-fat dairy products, and lean protein. Do not eat a lot of foods that are high in solid fats, added sugars, or sodium. Maintain a healthy weight Body mass index (BMI) is used to identify weight problems. It estimates body fat based on height and weight. Your health care provider can help determine your BMI and help you achieve or maintain a healthy weight. Get regular exercise Get regular exercise. This is one of the most important things you can do for your health. Most adults should: Exercise for at least 150 minutes each week. The exercise should increase your heart rate and make you sweat (moderate-intensity exercise). Do strengthening exercises at least twice a week. This is in addition to the moderate-intensity  exercise. Spend less time sitting. Even light physical activity can be beneficial. Watch cholesterol and blood lipids Have your blood tested for lipids and cholesterol at 48 years of age, then have this test every 5 years. Have your cholesterol levels checked more often if: Your lipid or cholesterol levels are high. You are older than 48 years of age. You are at high risk for heart disease. What should I know about cancer screening? Depending on your health history and family history, you may need to have cancer screening at various ages. This may include screening for: Breast cancer. Cervical cancer. Colorectal cancer. Skin cancer. Lung cancer. What should I know about heart disease, diabetes, and high blood pressure? Blood pressure and heart disease High blood pressure causes heart disease and increases the risk of stroke. This is more likely to develop in people who have high blood pressure readings or are overweight. Have your blood pressure checked: Every 3-5 years if you are 63-64 years of age. Every year if you are 65 years old or older. Diabetes Have regular diabetes screenings. This checks your fasting blood sugar level. Have the screening done: Once every three years after age 6 if you are at a normal weight and have a low risk for diabetes. More often and at a younger age if you are overweight or have a high risk for diabetes. What should I know about preventing infection? Hepatitis B If you have a higher risk for hepatitis B, you should be screened for this virus. Talk with your health care provider to find out if you are at risk for hepatitis B infection. Hepatitis  C Testing is recommended for: Everyone born from [birth year] through 1966. Anyone with known risk factors for hepatitis C. Sexually transmitted infections (STIs) Get screened for STIs, including gonorrhea and chlamydia, if: You are sexually active and are younger than 48 years of age. You are older than 48 years  of age and your health care provider tells you that you are at risk for this type of infection. Your sexual activity has changed since you were last screened, and you are at increased risk for chlamydia or gonorrhea. Ask your health care provider if you are at risk. Ask your health care provider about whether you are at high risk for HIV. Your health care provider may recommend a prescription medicine to help prevent HIV infection. If you choose to take medicine to prevent HIV, you should first get tested for HIV. You should then be tested every 3 months for as long as you are taking the medicine. Pregnancy If you are about to stop having your period (premenopausal) and you may become pregnant, seek counseling before you get pregnant. Take 400 to 800 micrograms (mcg) of folic acid every day if you become pregnant. Ask for birth control (contraception) if you want to prevent pregnancy. Osteoporosis and menopause Osteoporosis is a disease in which the bones lose minerals and strength with aging. This can result in bone fractures. If you are 52 years old or older, or if you are at risk for osteoporosis and fractures, ask your health care provider if you should: Be screened for bone loss. Take a calcium or vitamin D supplement to lower your risk of fractures. Be given hormone replacement therapy (HRT) to treat symptoms of menopause. Follow these instructions at home: Alcohol use Do not drink alcohol if: Your health care provider tells you not to drink. You are pregnant, may be pregnant, or are planning to become pregnant. If you drink alcohol: Limit how much you have to: 0-1 drink a day. Know how much alcohol is in your drink. In the [place] one drink equals one 12 oz bottle of beer (355 mL), one 5 oz glass of wine (148 mL), or one 1 oz glass of hard liquor (44 mL). Lifestyle Do not use any products that contain nicotine or tobacco. These products include cigarettes, chewing tobacco, and vaping  devices, such as e-cigarettes. If you need help quitting, ask your health care provider. Do not use street drugs. Do not share needles. Ask your health care provider for help if you need support or information about quitting drugs. General instructions Schedule regular health, dental, and eye exams. Stay current with your vaccines. Tell your health care provider if: You often feel depressed. You have ever been abused or do not feel safe at home. Summary Adopting a healthy lifestyle and getting preventive care are important in promoting health and wellness. Follow your health care provider's instructions about healthy diet, exercising, and getting tested or screened for diseases. Follow your health care provider's instructions on monitoring your cholesterol and blood pressure. This information is not intended to replace advice given to you by your health care provider. Make sure you discuss any questions you have with your health care provider. Document Revised: 02/08/2021 Document Reviewed: 02/08/2021 Elsevier Patient Education  2024 [employer].

## 2023-10-03 NOTE — Assessment & Plan Note (Signed)
 We discussed the importance of regular physical activity and healthy diet for prevention of chronic illness and/or complications. Preventive guidelines reviewed. Vaccination up to date. She is overdue for mammogram and pap smear, strongly recommend arranging appt with her gynecologist. Next CPE in a year.

## 2023-10-03 NOTE — Assessment & Plan Note (Signed)
 Continue nonpharmacologic treatment. Further recommendations will be given according to 10 years CVD risk score and lipid panel numbers.

## 2023-10-03 NOTE — Assessment & Plan Note (Addendum)
Continue OTC vit D 5000 U daily. Further recommendations according to 25 [place] vit D result.

## 2024-01-24 ENCOUNTER — Ambulatory Visit (INDEPENDENT_AMBULATORY_CARE_PROVIDER_SITE_OTHER): Admitting: Family Medicine

## 2024-01-24 ENCOUNTER — Encounter: Payer: Self-pay | Admitting: Family Medicine

## 2024-01-24 VITALS — BP 114/74 | HR 88 | Temp 98.4°F | Resp 12 | Ht 65.75 in | Wt 174.4 lb

## 2024-01-24 DIAGNOSIS — R8290 Unspecified abnormal findings in urine: Secondary | ICD-10-CM

## 2024-01-24 DIAGNOSIS — R3129 Other microscopic hematuria: Secondary | ICD-10-CM | POA: Diagnosis not present

## 2024-01-24 DIAGNOSIS — M545 Low back pain, unspecified: Secondary | ICD-10-CM

## 2024-01-24 DIAGNOSIS — R1010 Upper abdominal pain, unspecified: Secondary | ICD-10-CM

## 2024-01-24 LAB — POC URINALSYSI DIPSTICK (AUTOMATED)
Bilirubin, UA: NEGATIVE
Blood, UA: POSITIVE
Glucose, UA: NEGATIVE
Ketones, UA: NEGATIVE
Nitrite, UA: NEGATIVE
Protein, UA: NEGATIVE
Spec Grav, UA: 1.025 (ref 1.010–1.025)
Urobilinogen, UA: 0.2 U/dL
pH, UA: 6 (ref 5.0–8.0)

## 2024-01-24 NOTE — Progress Notes (Signed)
 ACUTE VISIT Chief Complaint  Patient presents with   Back Pain    With UTI symptoms   HPI: Mindy Lowe is a 48 y.o. female with a PMHx significant for ulcerative colitis, goiter, PCOS, back pain, migraines, HLD, vitamin D  deficiency, and interstitial cystitis, who is here today concern about possible UTI.  Patient complains of cloudy urine and occasional burning sensation while urinating since late 10/2023. Also endorses some decreased urine output.  No smoking hx.  Pertinent negatives include hematuria, increased urinary frequency or urgency, or fever.  Negative for vaginal discharge. No hx of pyelonephritis. She is not sexually active. She has not tried OTC treatment.  Back/abdominal pain:  Patient also complains of intermittent right-sided back pain since late January. She describes the pain as a dull pain and rates it as a 4/10. The pain worsens with certain foods and intense exercise.  Also endorses some upper abdominal pain occasionally with food intake. She has had this pain for some time. She has a intermittent episodes of heartburn, treats it with home remedies. She has not tried pharmacologic treatment.  She also mentions she has more frequent bowel movements than usual when she has upper abdominal pain.  No hx of nephrolithiases.  Hx of appendectomy many years ago. Pertinent negatives include nausea, vomiting,trauma, or unusual activities.   LMP: 06/2023.  Review of Systems  Constitutional:  Negative for activity change, appetite change, chills and unexpected weight change.  HENT:  Negative for mouth sores and sore throat.   Respiratory:  Negative for cough, shortness of breath and wheezing.   Cardiovascular:  Negative for chest pain and leg swelling.  Gastrointestinal:  Negative for blood in stool.  Genitourinary:  Negative for pelvic pain and urgency.  Musculoskeletal:  Positive for back pain.  Skin:  Negative for rash.  Neurological:  Negative for syncope  and weakness.  Psychiatric/Behavioral:  Negative for confusion.   See other pertinent positives and negatives in HPI.  Current Outpatient Medications on File Prior to Visit  Medication Sig Dispense Refill   aspirin 325 MG tablet Take 325 mg by mouth as needed for pain.     BLACK CURRANT SEED OIL PO Take by mouth daily.     CALCIUM PO Take 400 mg by mouth. 2 capsules daily     ferrous sulfate 325 (65 FE) MG tablet Take 325 mg by mouth daily with breakfast.     fluticasone  (FLONASE ) 50 MCG/ACT nasal spray Place 1 spray into both nostrils 2 (two) times daily. 16 g 0   ibuprofen (ADVIL,MOTRIN) 200 MG tablet Take 200 mg by mouth every 6 (six) hours as needed.     Lactobacillus (ACIDOPHILUS PO) Take by mouth. 2 a day     Multiple Vitamin (MULTIVITAMIN) tablet Take 1 tablet by mouth daily.     NON FORMULARY      No current facility-administered medications on file prior to visit.    Past Medical History:  Diagnosis Date   Colitis, ulcerative chronic (HCC)    Dr Milon Aloe   Cystitis, interstitial    Endometriosis    Goiter    Hyperprolactinemia (HCC)    Migraine aura without headache    Polycystic ovarian syndrome    Allergies  Allergen Reactions   Codeine Other (See Comments)    Other reaction(s): Dizziness (intolerance)    Social History   Socioeconomic History   Marital status: Single    Spouse name: Not on file   Number of children: Not on file  Years of education: Not on file   Highest education level: Not on file  Occupational History   Not on file  Tobacco Use   Smoking status: Never   Smokeless tobacco: Never  Vaping Use   Vaping status: Never Used  Substance and Sexual Activity   Alcohol use: Yes    Alcohol/week: 0.0 standard drinks of alcohol    Comment: socially 1-2 a month   Drug use: No   Sexual activity: Yes    Partners: Male    Birth control/protection: Pill  Other Topics Concern   Not on file  Social History Narrative   Not on file   Social  Drivers of Health   Financial Resource Strain: Not on file  Food Insecurity: Not on file  Transportation Needs: Not on file  Physical Activity: Not on file  Stress: Not on file  Social Connections: Not on file   Vitals:   01/24/24 1534  BP: 114/74  Pulse: 88  Resp: 12  Temp: 98.4 F (36.9 C)  SpO2: 98%   Body mass index is 28.36 kg/m.  Physical Exam Vitals and nursing note reviewed.  Constitutional:      General: She is not in acute distress.    Appearance: She is well-developed.  HENT:     Head: Normocephalic and atraumatic.     Mouth/Throat:     Mouth: Mucous membranes are moist.  Eyes:     Conjunctiva/sclera: Conjunctivae normal.  Cardiovascular:     Rate and Rhythm: Normal rate and regular rhythm.  Pulmonary:     Effort: Pulmonary effort is normal. No respiratory distress.     Breath sounds: Normal breath sounds.  Abdominal:     Palpations: Abdomen is soft. There is no hepatomegaly or mass.     Tenderness: There is no abdominal tenderness. There is no right CVA tenderness or left CVA tenderness.  Musculoskeletal:     Lumbar back: No tenderness. Negative right straight leg raise test and negative left straight leg raise test.  Lymphadenopathy:     Cervical: No cervical adenopathy.  Skin:    General: Skin is warm.     Findings: No erythema.  Neurological:     General: No focal deficit present.     Mental Status: She is alert and oriented to person, place, and time.     Gait: Gait normal.  Psychiatric:        Mood and Affect: Mood and affect normal.   ASSESSMENT AND PLAN:  [name] was seen today for UTI symptoms and back pain.   Right-sided low back pain without sciatica, unspecified chronicity We discussed possible causes of back pain,musculoskeletal, gallbladder disease, and urologic among some. Hx of back pain but states that this pain is different to her chronic pain. I do not think back imaging is needed at this time. Monitor for new  symptoms.   Because sometimes related with abdominal pain and increasing bowel movements,we will obtain RUQ US  and will give recommendations accordingly. -     Urine Culture; Future  Cloudy urine Hx does not suggest an infectious process. Urine dipstick today + blood and trace of leuk. Increase water intake. Further recommendations according to Ucx result.  -     POCT Urinalysis Dipstick (Automated) -     Urine Culture; Future -     Urine Microscopic; Future  Upper abdominal pain Examination today negative. ? Dyspepsia. Having occasional heartburn. Gallbladder disease also to be considered. She is not interested in trying PPI.  GERD precautions to continue.  -     US  ABDOMEN LIMITED RUQ (LIVER/GB); Future  Microscopic hematuria Abdominal CT w contrast in 11/2007 due to right flank pain and microscopic hematuria was negative. Urine will be sent for microscopic and recommendations will be given according to lab result.  Return if symptoms worsen or fail to improve.  I, Fritz Jewel Wierda, acting as a scribe for Jerlean Peralta [place], MD., have documented all relevant documentation on the behalf of Payne Garske [place], MD, as directed by  Taya Ashbaugh [place], MD while in the presence of Conor Lata [place], MD.   I, Burnis Halling [place], MD, have reviewed all documentation for this visit. The documentation on 01/25/24 for the exam, diagnosis, procedures, and orders are all accurate and complete.  Anan Dapolito G. [place], MD  [facility]. [place] office.

## 2024-01-24 NOTE — Patient Instructions (Addendum)
 A few things to remember from today's visit:  Cloudy urine - Plan: POCT Urinalysis Dipstick (Automated)  Upper abdominal pain - Plan: US  ABDOMEN LIMITED RUQ (LIVER/GB)  Acute right-sided low back pain without sciatica  Microscopic hematuria  Will wait for urine culture. Monitor for new symptoms. Ultrasound will be arranged to evaluate for gallbladder stones.  If heartburn is persistent we can try Omeprazole  for 6-8 weeks then wean it off.  Topical icy hot may help with back pain if it is muscular.  If you need refills for medications you take chronically, please call your pharmacy. Do not use My Chart to request refills or for acute issues that need immediate attention. If you send a my chart message, it may take a few days to be addressed, specially if I am not in the office.  Please be sure medication list is accurate. If a new problem present, please set up appointment sooner than planned today.

## 2024-01-25 ENCOUNTER — Encounter: Payer: Self-pay | Admitting: Family Medicine

## 2024-01-25 LAB — URINALYSIS, MICROSCOPIC ONLY

## 2024-01-25 LAB — URINE CULTURE
MICRO NUMBER:: 16365449
Result:: NO GROWTH
SPECIMEN QUALITY:: ADEQUATE

## 2024-02-02 ENCOUNTER — Other Ambulatory Visit

## 2024-02-08 ENCOUNTER — Other Ambulatory Visit

## 2024-02-19 ENCOUNTER — Ambulatory Visit
Admission: RE | Admit: 2024-02-19 | Discharge: 2024-02-19 | Disposition: A | Discharge status: Home / Self Care | Source: Ambulatory Visit | Attending: Family Medicine | Admitting: Family Medicine

## 2024-02-19 DIAGNOSIS — R1010 Upper abdominal pain, unspecified: Secondary | ICD-10-CM

## 2024-02-21 ENCOUNTER — Telehealth: Payer: Self-pay | Admitting: Family Medicine

## 2024-02-21 DIAGNOSIS — R21 Rash and other nonspecific skin eruption: Secondary | ICD-10-CM

## 2024-02-21 NOTE — Telephone Encounter (Signed)
 Copied from CRM [id number]. Topic: Referral - Request for Referral >> Feb 20, 2024  4:35 PM Juleen Oakland F wrote: Did the patient discuss referral with their provider in the last year? Yes (If No - schedule appointment) (If Yes - send message)  Appointment offered? No  Type of order/referral and detailed reason for visit: Skin Rash flaring up - patient needs updated referral since previous referral expired 2023  Preference of office, provider, location: [facility]   If referral order, have you been seen by this specialty before? No (If Yes, this issue or another issue? When? Where?  Can we respond through MyChart? No, please call patient once referral is done

## 2024-02-21 NOTE — Telephone Encounter (Signed)
 Referral placed.

## 2024-02-22 ENCOUNTER — Ambulatory Visit: Payer: Self-pay | Admitting: Family Medicine

## 2024-03-21 ENCOUNTER — Ambulatory Visit: Payer: Self-pay

## 2024-03-21 NOTE — Telephone Encounter (Signed)
 FYI Only or Action Required?: Action required by provider: medication refill request. (Topical cream, patient does not know the name)  Patient was last seen in primary care on 01/24/2024 by [place], Betty G, MD. Called Nurse Triage reporting Rash. Symptoms began about a  year ago, flare up x several days. Interventions attempted: Prescription medications: topical cream for rash. Symptoms are: bilateral arms with 5-7 clusters per arm with red flat and bumpy rash gradually worsening without the topical cream.  Triage Disposition: See PCP When Office is Open (Within 3 Days)  Patient/caregiver understands and will follow disposition?: No, wishes to speak with PCP                 Copied from CRM [id number]. Topic: Clinical - Medication Question >> Mar 21, 2024  2:45 PM Alyse July wrote: Reason for CRM: Patient would like to know if she can be prescribed an ointment for a skin rash. Medication was previous prescribed by provider but patient not sure of name. Patient unable to see dermatologist til August. Please contact to advise. CB#[phone]   ----------------------------------------------------------------------- From previous Reason for Contact - Medication Refill: Medication:   Has the patient contacted their pharmacy? Yes  This is the patient's preferred pharmacy:  [id number] [place], [place] - [address] [place] [address] Phone: [phone] Fax: [phone]  Is this the correct pharmacy for this prescription? Yes If no, delete pharmacy and type the correct one.   Has the prescription been filled recently? No  Is the patient out of the medication? Yes  Has the patient been seen for an appointment in the last year OR does the patient have an upcoming appointment? Yes  Can we respond through MyChart? Yes  Agent: Please be advised that Rx refills may take up to 3 business days. We ask that you follow-up with your  pharmacy. Reason for Disposition  Localized rash present > 7 days  Answer Assessment - Initial Assessment Questions Patient seen by dermatologist in the past for the rash and was using a topical cream that was prescribed. She does not know the name of the cream and states she can not get in to see the dermatologist until August. Patient is asking if Dr [place] can prescribe her something for the rash.  1. APPEARANCE of RASH: Describe the rash.      Clusters of flat and raised bumps, red and pink but turning purple.  2. LOCATION: Where is the rash located?      Bilateral arms.  3. NUMBER: How many spots are there?      5 on one arm and 6-7 on the other.  4. SIZE: How big are the spots? (Inches, centimeters or compare to size of a coin)      Smaller than a dime and one close to the size of a dime.  5. ONSET: When did the rash start?      Comes and goes x 1 year, recent flare up for a few days.  6. ITCHING: Does the rash itch? If Yes, ask: How bad is the itch?  (Scale 0-10; or none, mild, moderate, severe)     Yes, mild to moderate. Comes and goes.  7. PAIN: Does the rash hurt? If Yes, ask: How bad is the pain?  (Scale 0-10; or none, mild, moderate, severe)    - NONE (0): no pain    - MILD (1-3): doesn't interfere with normal activities     - MODERATE (4-7): interferes  with normal activities or awakens from sleep     - SEVERE (8-10): excruciating pain, unable to do any normal activities     No.  8. OTHER SYMPTOMS: Do you have any other symptoms? (e.g., fever)     Patient denies fever, difficulty breathing.  9. PREGNANCY: Is there any chance you are pregnant? When was your last menstrual period?     N/A.  Protocols used: Rash or Redness - Localized-A-AH

## 2024-03-22 ENCOUNTER — Other Ambulatory Visit: Payer: Self-pay | Admitting: Family Medicine

## 2024-03-22 MED ORDER — CLOTRIMAZOLE-BETAMETHASONE 1-0.05 % EX CREA: 1.0000 | TOPICAL_CREAM | Freq: Every day | CUTANEOUS | 0 refills | Status: AC | PRN

## 2024-03-22 NOTE — Telephone Encounter (Signed)
 Patient is aware of message below & will let us  know if cream doesn't help.

## 2024-03-22 NOTE — Telephone Encounter (Signed)
 You prescribed Lotrisone  for her in the past - okay to refill? She has appt with Derm in August.

## 2024-03-22 NOTE — Telephone Encounter (Signed)
 Rx for Lotrisone  cream sent to her pharmacy to use small amount on affected area daily as needed. Thanks, BJ
